# Patient Record
Sex: Female | Born: 1960 | Race: White | Hispanic: No | Smoking: Never smoker
Health system: Southern US, Community
[De-identification: ages and names within clinical notes are randomized; demographics above are authoritative.]

## PROBLEM LIST (undated history)

## (undated) DIAGNOSIS — D649 Anemia, unspecified: Secondary | ICD-10-CM

## (undated) DIAGNOSIS — I2510 Atherosclerotic heart disease of native coronary artery without angina pectoris: Secondary | ICD-10-CM

## (undated) DIAGNOSIS — R7303 Prediabetes: Secondary | ICD-10-CM

## (undated) DIAGNOSIS — I499 Cardiac arrhythmia, unspecified: Secondary | ICD-10-CM

## (undated) DIAGNOSIS — K219 Gastro-esophageal reflux disease without esophagitis: Secondary | ICD-10-CM

## (undated) DIAGNOSIS — E785 Hyperlipidemia, unspecified: Secondary | ICD-10-CM

## (undated) DIAGNOSIS — M1990 Unspecified osteoarthritis, unspecified site: Secondary | ICD-10-CM

## (undated) DIAGNOSIS — I10 Essential (primary) hypertension: Secondary | ICD-10-CM

## (undated) HISTORY — PX: DIAGNOSTIC LAPAROSCOPY: SUR761

## (undated) HISTORY — PX: CHOLECYSTECTOMY: SHX55

## (undated) HISTORY — PX: DILATION AND CURETTAGE OF UTERUS: SHX78

## (undated) HISTORY — PX: ABDOMINAL HYSTERECTOMY: SHX81

---

## 2004-03-26 ENCOUNTER — Ambulatory Visit: Payer: Self-pay | Admitting: Family Medicine

## 2004-04-06 ENCOUNTER — Ambulatory Visit: Payer: Self-pay | Admitting: Family Medicine

## 2005-05-12 ENCOUNTER — Ambulatory Visit: Payer: Self-pay | Admitting: Unknown Physician Specialty

## 2006-06-07 ENCOUNTER — Ambulatory Visit: Payer: Self-pay | Admitting: Unknown Physician Specialty

## 2007-07-12 ENCOUNTER — Ambulatory Visit: Payer: Self-pay | Admitting: Unknown Physician Specialty

## 2008-07-18 ENCOUNTER — Ambulatory Visit: Payer: Self-pay | Admitting: Unknown Physician Specialty

## 2009-04-14 ENCOUNTER — Ambulatory Visit: Payer: Self-pay | Admitting: Unknown Physician Specialty

## 2009-04-25 ENCOUNTER — Ambulatory Visit: Payer: Self-pay | Admitting: Unknown Physician Specialty

## 2009-08-06 ENCOUNTER — Ambulatory Visit: Payer: Self-pay | Admitting: Unknown Physician Specialty

## 2009-11-07 ENCOUNTER — Inpatient Hospital Stay (HOSPITAL_COMMUNITY): Admission: RE | Admit: 2009-11-07 | Discharge: 2009-11-08 | Payer: Self-pay | Admitting: Obstetrics & Gynecology

## 2009-11-07 ENCOUNTER — Encounter (INDEPENDENT_AMBULATORY_CARE_PROVIDER_SITE_OTHER): Payer: Self-pay | Admitting: Obstetrics & Gynecology

## 2010-05-14 LAB — BASIC METABOLIC PANEL
BUN: 10 mg/dL (ref 6–23)
CO2: 25 mEq/L (ref 19–32)
CO2: 26 mEq/L (ref 19–32)
Calcium: 8.4 mg/dL (ref 8.4–10.5)
Calcium: 8.9 mg/dL (ref 8.4–10.5)
Creatinine, Ser: 0.64 mg/dL (ref 0.4–1.2)
Creatinine, Ser: 0.65 mg/dL (ref 0.4–1.2)
GFR calc Af Amer: >60 mL/min (ref >60)
GFR calc Af Amer: >60 mL/min (ref >60)
Glucose, Bld: 100 mg/dL — ABNORMAL HIGH (ref 70–99)

## 2010-05-14 LAB — CBC
MCH: 30 pg (ref 26.0–34.0)
MCH: 30.5 pg (ref 26.0–34.0)
MCHC: 33.7 g/dL (ref 30.0–36.0)
MCHC: 34.4 g/dL (ref 30.0–36.0)
Platelets: 274 10*3/uL (ref 150–400)
Platelets: 292 10*3/uL (ref 150–400)
RBC: 4.46 MIL/uL (ref 3.87–5.11)
RDW: 15.3 % (ref 11.5–15.5)

## 2010-05-14 LAB — PREGNANCY, URINE: Preg Test, Ur: NEGATIVE

## 2010-10-13 ENCOUNTER — Ambulatory Visit: Payer: Self-pay | Admitting: Unknown Physician Specialty

## 2011-09-11 IMAGING — MG MAM DGTL SCREENING MAMMO W/CAD
1 series · 5 of 5 positions shown · non-contrast
Comparison: none

REASON FOR EXAM: SCR MAMMO
COMMENTS:

PROCEDURE:     MAM - MAM DGTL SCREENING MAMMO W/CAD  - October 13, 2010  [DATE]
RESULT:

[Series 4297: R CC · right · 5 of 5 slices shown]
[im 1/5]
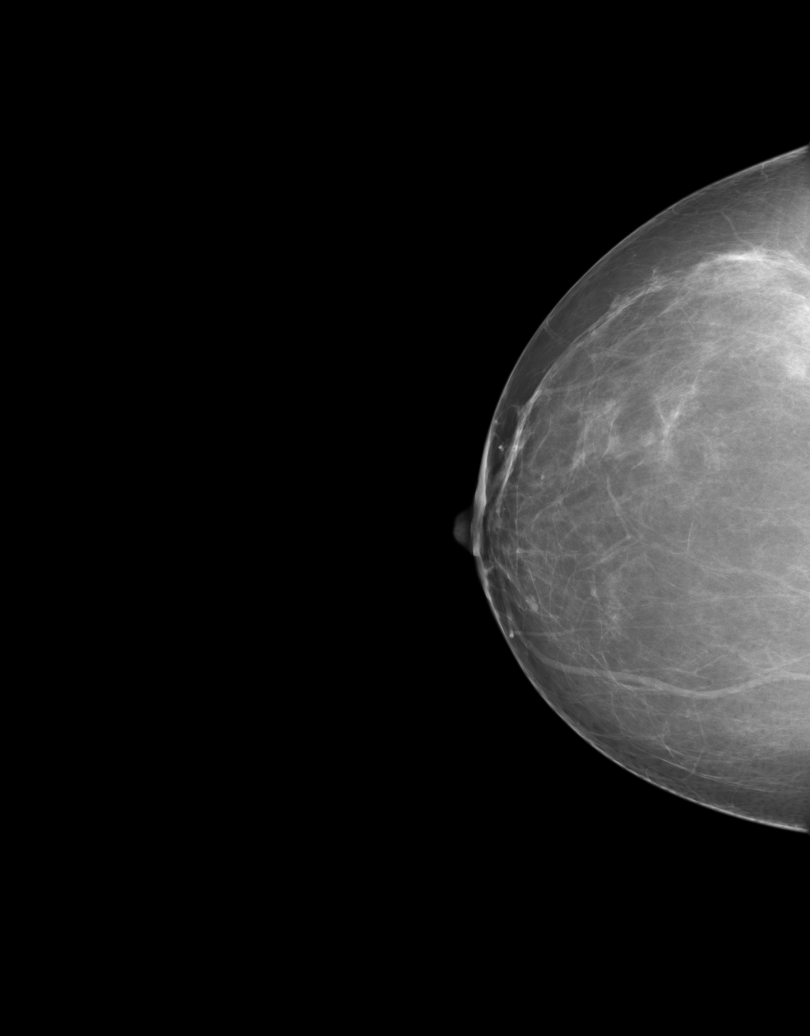
[im 2/5]
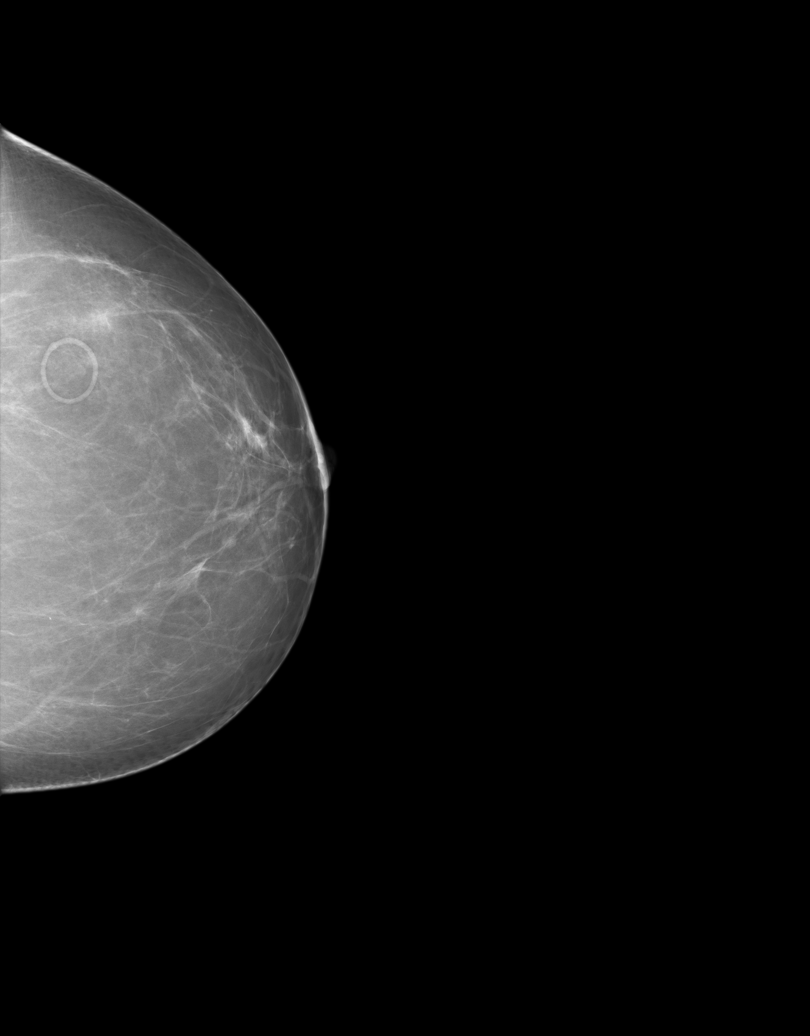
[im 3/5]
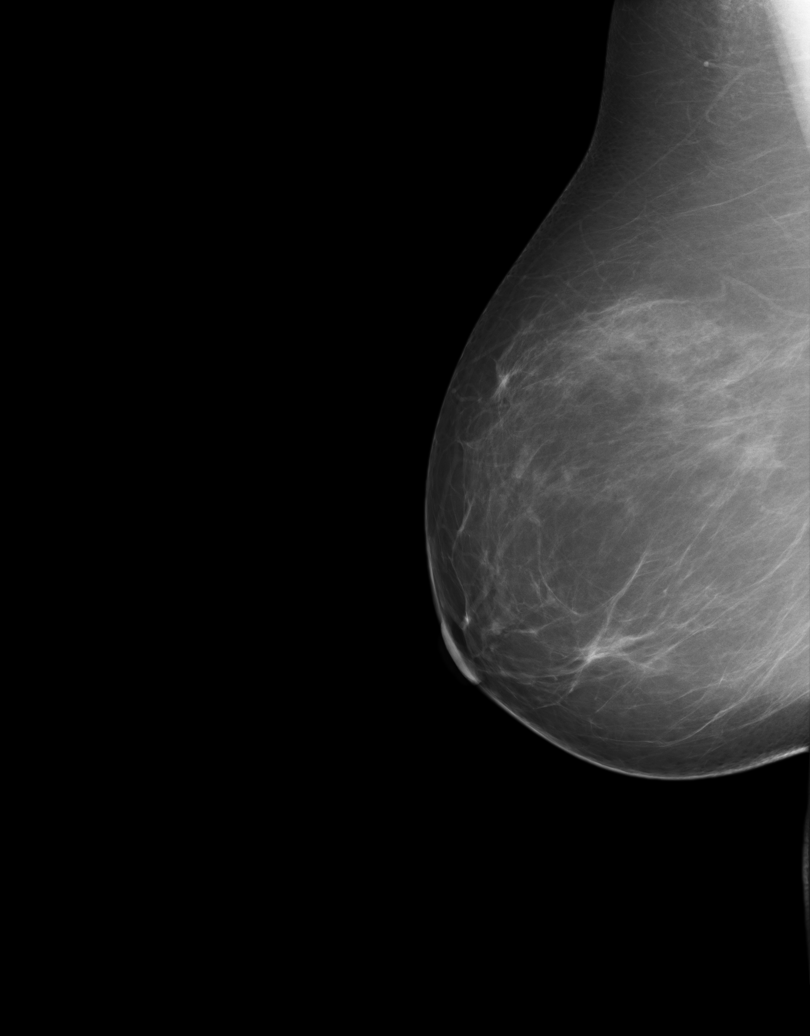
[im 4/5]
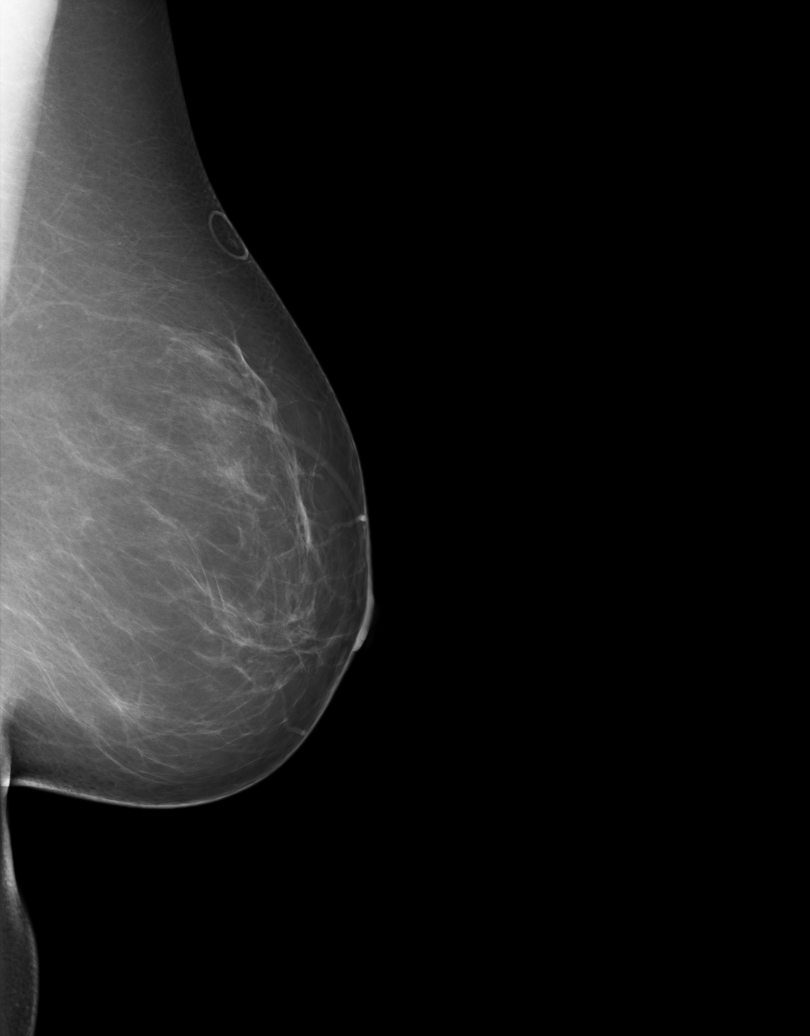
[im 5/5]
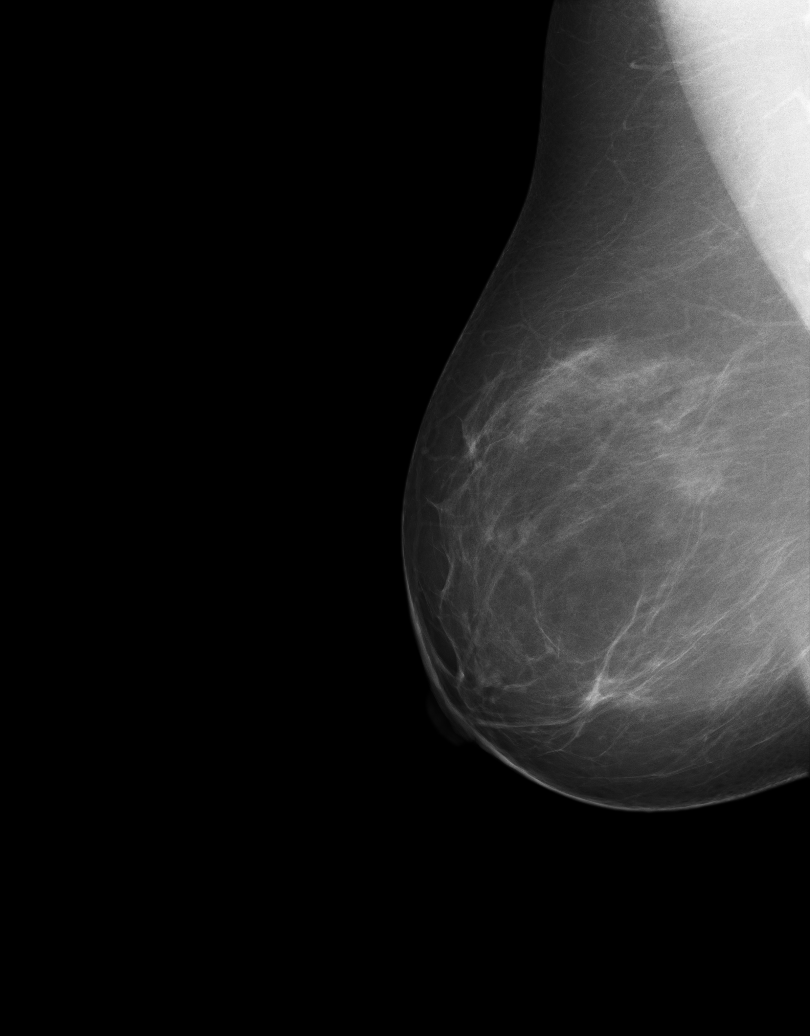

[5 of 5 positions shown; findings below may reference images not displayed]

FINDINGS: There is a family history of breast cancer in a paternal aunt.

Comparison is made to the previous digital images of 08/06/2009 as well as
07/18/2008 and 12/29/2000.

The breasts exhibit a mildly dense parenchymal pattern without evidence of
an area of developing parenchymal density or dominant mass. A skin lesion is
marked in the upper slightly lateral posterior left breast.
IMPRESSION: Stable, benign appearing bilateral mammogram.

BI-RADS: Category 2 - Benign Findings

RECOMMENDATION:  Please continue to encourage annual mammographic follow-up.

Thank you for this opportunity to contribute to the care of your patient.

A NEGATIVE MAMMOGRAM REPORT DOES NOT PRECLUDE BIOPSY OR OTHER EVALUATION OF
A CLINICALLY PALPABLE OR OTHERWISE SUSPICIOUS MASS OR LESION. BREAST CANCER
MAY NOT BE DETECTED BY MAMMOGRAPHY IN UP TO 10% OF CASES.

## 2011-09-16 ENCOUNTER — Ambulatory Visit: Payer: Self-pay | Admitting: Physician Assistant

## 2011-10-14 ENCOUNTER — Ambulatory Visit: Payer: Self-pay | Admitting: Physician Assistant

## 2012-10-16 ENCOUNTER — Ambulatory Visit: Payer: Self-pay | Admitting: Physician Assistant

## 2013-11-01 DIAGNOSIS — M1710 Unilateral primary osteoarthritis, unspecified knee: Secondary | ICD-10-CM | POA: Insufficient documentation

## 2013-12-11 ENCOUNTER — Ambulatory Visit: Payer: Self-pay | Admitting: Physician Assistant

## 2014-03-25 ENCOUNTER — Ambulatory Visit: Payer: Self-pay | Admitting: Gastroenterology

## 2014-06-24 LAB — SURGICAL PATHOLOGY

## 2014-12-05 ENCOUNTER — Other Ambulatory Visit: Payer: Self-pay | Admitting: Physician Assistant

## 2014-12-05 DIAGNOSIS — Z1231 Encounter for screening mammogram for malignant neoplasm of breast: Secondary | ICD-10-CM

## 2014-12-17 ENCOUNTER — Ambulatory Visit
Admission: RE | Admit: 2014-12-17 | Discharge: 2014-12-17 | Disposition: A | Discharge status: Home / Self Care | Payer: Managed Care, Other (non HMO) | Source: Ambulatory Visit | Attending: Physician Assistant | Admitting: Physician Assistant

## 2014-12-17 DIAGNOSIS — Z1231 Encounter for screening mammogram for malignant neoplasm of breast: Secondary | ICD-10-CM | POA: Insufficient documentation

## 2016-04-07 ENCOUNTER — Other Ambulatory Visit: Payer: Self-pay | Admitting: Physician Assistant

## 2016-04-07 DIAGNOSIS — Z1231 Encounter for screening mammogram for malignant neoplasm of breast: Secondary | ICD-10-CM

## 2016-04-30 ENCOUNTER — Ambulatory Visit: Payer: Managed Care, Other (non HMO)

## 2016-06-15 ENCOUNTER — Ambulatory Visit
Admission: RE | Admit: 2016-06-15 | Discharge: 2016-06-15 | Disposition: A | Discharge status: Home / Self Care | Payer: Managed Care, Other (non HMO) | Source: Ambulatory Visit | Attending: Physician Assistant | Admitting: Physician Assistant

## 2016-06-15 DIAGNOSIS — Z1231 Encounter for screening mammogram for malignant neoplasm of breast: Secondary | ICD-10-CM | POA: Insufficient documentation

## 2016-10-07 DIAGNOSIS — R7303 Prediabetes: Secondary | ICD-10-CM | POA: Insufficient documentation

## 2018-12-04 ENCOUNTER — Other Ambulatory Visit: Payer: Self-pay | Admitting: Physician Assistant

## 2018-12-04 DIAGNOSIS — Z1231 Encounter for screening mammogram for malignant neoplasm of breast: Secondary | ICD-10-CM

## 2019-03-05 ENCOUNTER — Ambulatory Visit
Admission: RE | Admit: 2019-03-05 | Discharge: 2019-03-05 | Disposition: A | Discharge status: Home / Self Care | Payer: 59 | Source: Ambulatory Visit | Attending: Physician Assistant | Admitting: Physician Assistant

## 2019-03-05 DIAGNOSIS — Z1231 Encounter for screening mammogram for malignant neoplasm of breast: Secondary | ICD-10-CM | POA: Insufficient documentation

## 2019-09-10 ENCOUNTER — Other Ambulatory Visit: Payer: Self-pay

## 2019-09-10 ENCOUNTER — Other Ambulatory Visit
Admission: RE | Admit: 2019-09-10 | Discharge: 2019-09-10 | Disposition: A | Discharge status: Home / Self Care | Payer: 59 | Source: Ambulatory Visit | Attending: Cardiology | Admitting: Cardiology

## 2019-09-10 DIAGNOSIS — Z01812 Encounter for preprocedural laboratory examination: Secondary | ICD-10-CM | POA: Diagnosis present

## 2019-09-10 DIAGNOSIS — Z20822 Contact with and (suspected) exposure to covid-19: Secondary | ICD-10-CM | POA: Insufficient documentation

## 2019-09-10 LAB — SARS CORONAVIRUS 2 (TAT 6-24 HRS): SARS Coronavirus 2: NEGATIVE

## 2019-09-12 ENCOUNTER — Encounter: Payer: Self-pay | Admitting: Cardiology

## 2019-09-12 ENCOUNTER — Encounter
Admission: RE | Disposition: A | Discharge status: Home / Self Care | Payer: Self-pay | Source: Home / Self Care | Attending: Cardiology

## 2019-09-12 ENCOUNTER — Other Ambulatory Visit: Payer: Self-pay

## 2019-09-12 ENCOUNTER — Ambulatory Visit
Admission: RE | Admit: 2019-09-12 | Discharge: 2019-09-12 | Disposition: A | Discharge status: Home / Self Care | Payer: 59 | Attending: Cardiology | Admitting: Cardiology

## 2019-09-12 DIAGNOSIS — K5790 Diverticulosis of intestine, part unspecified, without perforation or abscess without bleeding: Secondary | ICD-10-CM | POA: Insufficient documentation

## 2019-09-12 DIAGNOSIS — E669 Obesity, unspecified: Secondary | ICD-10-CM | POA: Insufficient documentation

## 2019-09-12 DIAGNOSIS — Z79899 Other long term (current) drug therapy: Secondary | ICD-10-CM | POA: Diagnosis not present

## 2019-09-12 DIAGNOSIS — Z7982 Long term (current) use of aspirin: Secondary | ICD-10-CM | POA: Diagnosis not present

## 2019-09-12 DIAGNOSIS — E785 Hyperlipidemia, unspecified: Secondary | ICD-10-CM | POA: Insufficient documentation

## 2019-09-12 DIAGNOSIS — R002 Palpitations: Secondary | ICD-10-CM | POA: Insufficient documentation

## 2019-09-12 DIAGNOSIS — I10 Essential (primary) hypertension: Secondary | ICD-10-CM | POA: Insufficient documentation

## 2019-09-12 DIAGNOSIS — R0789 Other chest pain: Secondary | ICD-10-CM | POA: Diagnosis not present

## 2019-09-12 DIAGNOSIS — I2510 Atherosclerotic heart disease of native coronary artery without angina pectoris: Secondary | ICD-10-CM | POA: Insufficient documentation

## 2019-09-12 DIAGNOSIS — Z6838 Body mass index (BMI) 38.0-38.9, adult: Secondary | ICD-10-CM | POA: Insufficient documentation

## 2019-09-12 DIAGNOSIS — D509 Iron deficiency anemia, unspecified: Secondary | ICD-10-CM | POA: Insufficient documentation

## 2019-09-12 DIAGNOSIS — R9439 Abnormal result of other cardiovascular function study: Secondary | ICD-10-CM | POA: Diagnosis present

## 2019-09-12 HISTORY — DX: Gastro-esophageal reflux disease without esophagitis: K21.9

## 2019-09-12 HISTORY — PX: LEFT HEART CATH AND CORONARY ANGIOGRAPHY: CATH118249

## 2019-09-12 HISTORY — DX: Essential (primary) hypertension: I10

## 2019-09-12 SURGERY — LEFT HEART CATH AND CORONARY ANGIOGRAPHY
Anesthesia: Moderate Sedation | Laterality: Left

## 2019-09-12 MED ORDER — FENTANYL CITRATE (PF) 100 MCG/2ML IJ SOLN
INTRAMUSCULAR | Status: DC | PRN
  Administered 2019-09-12: 50 ug via INTRAVENOUS

## 2019-09-12 MED ORDER — ACETAMINOPHEN 325 MG PO TABS: 650.0000 mg | ORAL_TABLET | ORAL | Status: DC | PRN

## 2019-09-12 MED ORDER — VERAPAMIL HCL 2.5 MG/ML IV SOLN
INTRAVENOUS | Status: DC | PRN
  Administered 2019-09-12: 2.5 mg via INTRA_ARTERIAL

## 2019-09-12 MED ORDER — LABETALOL HCL 5 MG/ML IV SOLN: 10.0000 mg | INTRAVENOUS | Status: DC | PRN

## 2019-09-12 MED ORDER — ASPIRIN 81 MG PO CHEW: 81.0000 mg | CHEWABLE_TABLET | ORAL | Status: AC

## 2019-09-12 MED ORDER — SODIUM CHLORIDE 0.9% FLUSH: 3.0000 mL | Freq: Two times a day (BID) | INTRAVENOUS | Status: DC

## 2019-09-12 MED ORDER — ONDANSETRON HCL 4 MG/2ML IJ SOLN: 4.0000 mg | Freq: Four times a day (QID) | INTRAMUSCULAR | Status: DC | PRN

## 2019-09-12 MED ORDER — FENTANYL CITRATE (PF) 100 MCG/2ML IJ SOLN
INTRAMUSCULAR | Status: AC
  Filled 2019-09-12: qty 2

## 2019-09-12 MED ORDER — HEPARIN SODIUM (PORCINE) 1000 UNIT/ML IJ SOLN
INTRAMUSCULAR | Status: AC
  Filled 2019-09-12: qty 1

## 2019-09-12 MED ORDER — HEPARIN (PORCINE) IN NACL 1000-0.9 UT/500ML-% IV SOLN
INTRAVENOUS | Status: DC | PRN
  Administered 2019-09-12: 1000 mL

## 2019-09-12 MED ORDER — SODIUM CHLORIDE 0.9 % WEIGHT BASED INFUSION: 1.0000 mL/kg/h | INTRAVENOUS | Status: DC

## 2019-09-12 MED ORDER — SODIUM CHLORIDE 0.9 % WEIGHT BASED INFUSION
3.0000 mL/kg/h | INTRAVENOUS | Status: AC
  Administered 2019-09-12: 3 mL/kg/h via INTRAVENOUS

## 2019-09-12 MED ORDER — SODIUM CHLORIDE 0.9% FLUSH: 3.0000 mL | INTRAVENOUS | Status: DC | PRN

## 2019-09-12 MED ORDER — IOHEXOL 300 MG/ML  SOLN
INTRAMUSCULAR | Status: DC | PRN
  Administered 2019-09-12: 75 mL

## 2019-09-12 MED ORDER — ASPIRIN 81 MG PO CHEW
CHEWABLE_TABLET | ORAL | Status: AC
  Administered 2019-09-12: 81 mg via ORAL
  Filled 2019-09-12: qty 1

## 2019-09-12 MED ORDER — MIDAZOLAM HCL 2 MG/2ML IJ SOLN
INTRAMUSCULAR | Status: AC
  Filled 2019-09-12: qty 2

## 2019-09-12 MED ORDER — HYDRALAZINE HCL 20 MG/ML IJ SOLN: 10.0000 mg | INTRAMUSCULAR | Status: DC | PRN

## 2019-09-12 MED ORDER — SODIUM CHLORIDE 0.9 % IV SOLN: 250.0000 mL | INTRAVENOUS | Status: DC | PRN

## 2019-09-12 MED ORDER — MIDAZOLAM HCL 2 MG/2ML IJ SOLN
INTRAMUSCULAR | Status: DC | PRN
  Administered 2019-09-12: 1 mg via INTRAVENOUS

## 2019-09-12 MED ORDER — HEPARIN (PORCINE) IN NACL 1000-0.9 UT/500ML-% IV SOLN
INTRAVENOUS | Status: AC
  Filled 2019-09-12: qty 1000

## 2019-09-12 MED ORDER — HEPARIN SODIUM (PORCINE) 1000 UNIT/ML IJ SOLN
INTRAMUSCULAR | Status: DC | PRN
  Administered 2019-09-12: 5000 [IU] via INTRAVENOUS

## 2019-09-12 MED ORDER — VERAPAMIL HCL 2.5 MG/ML IV SOLN
INTRAVENOUS | Status: AC
  Filled 2019-09-12: qty 2

## 2019-09-12 SURGICAL SUPPLY — 8 items
CATH 5F 110X4 TIG (CATHETERS) ×2 IMPLANT
CATH INFINITI 5FR ANG PIGTAIL (CATHETERS) ×2 IMPLANT
DEVICE RAD TR BAND REGULAR (VASCULAR PRODUCTS) ×2 IMPLANT
GLIDESHEATH SLEND SS 6F .021 (SHEATH) ×2 IMPLANT
GUIDEWIRE INQWIRE 1.5J.035X260 (WIRE) ×1 IMPLANT
INQWIRE 1.5J .035X260CM (WIRE) ×2
KIT MANI 3VAL PERCEP (MISCELLANEOUS) ×2 IMPLANT
PACK CARDIAC CATH (CUSTOM PROCEDURE TRAY) ×2 IMPLANT

## 2019-09-12 NOTE — Discharge Instructions (Signed)
Coronary Artery Disease, Female Coronary artery disease (CAD) is a condition in which the arteries that lead to the heart (coronary arteries) become narrow or blocked. The narrowing or blockage can lead to decreased blood flow to the heart. Prolonged reduced blood flow can cause a heart attack (myocardial infarction or MI). This condition may also be called coronary heart disease. Because CAD is the leading cause of death in women, it is important to understand what causes this condition and how it is treated. What are the causes? CAD is most often caused by atherosclerosis. This is the buildup of fat and cholesterol (plaque) on the inside of the arteries. Over time, the plaque may narrow or block the artery, reducing blood flow to the heart. Plaque can also become weak and break off within a coronary artery and cause a sudden blockage. Other less common causes of CAD include:  A blood clot or a piece of a blood clot or other substance that blocks the flow of blood in a coronary artery (embolism).  A tearing of the artery (spontaneous coronary artery dissection).  An enlargement of an artery (aneurysm).  Inflammation (vasculitis) in the artery wall. What increases the risk? The following factors may make you more likely to develop this condition:  Age. Women over age 66 are at a greater risk of CAD.  Family history of CAD.  High blood pressure (hypertension).  Diabetes.  High cholesterol levels.  Tobacco use.  Lack of exercise.  Menopause. ? All postmenopausal women are at greater risk of CAD. ? Women who have experienced menopause between the ages of 64-45 (early menopause) are at a higher risk of CAD. ? Women who have experienced menopause before age 77 (premature menopause) are at a very high risk of CAD.  Excessive alcohol use.  A diet high in saturated and trans fats, such as fried food and processed meat. Other possible risk factors include:  High stress  levels.  Depression.  Obesity.  Sleep apnea. What are the signs or symptoms? Many people do not have any symptoms during the early stages of CAD. As the condition progresses, symptoms may include:  Chest pain (angina). The pain can: ? Feel like crushing or squeezing, or like a tightness, pressure, fullness, or heaviness in the chest. ? Last more than a few minutes or can stop and recur. The pain tends to get worse with exercise or stress and to fade with rest.  Pain in the arms, neck, jaw, ear, or back.  Unexplained heartburn or indigestion.  Shortness of breath.  Nausea.  Sudden cold sweats.  Sudden light-headedness.  Fluttering or fast heartbeat (palpitations). Many women have chest discomfort and the other symptoms. However, women often have unusual (atypical) symptoms, such as:  Fatigue.  Vomiting.  Unexplained feelings of nervousness or anxiety.  Unexplained weakness.  Dizziness or fainting. How is this diagnosed? This condition is diagnosed based on:  Your family and medical history.  A physical exam.  Tests, including: ? A test to check the electrical signals in your heart (electrocardiogram). ? Exercise stress test. This looks for signs of blockage when the heart is stressed with exercise, such as running on a treadmill. ? Pharmacologic stress test. This test looks for signs of blockage when the heart is being stressed with a medicine. ? Blood tests. ? Coronary angiogram. This is a procedure to look at the coronary arteries to see if there is any blockage. During this test, a dye is injected into your arteries so they  appear on an X-ray. ? Coronary artery CT scan. This CT scan helps detect calcium deposits in your coronary arteries. Calcium deposits are an indicator of CAD. ? A test that uses sound waves to take a picture of your heart (echocardiogram). ? Chest X-ray. How is this treated? This condition may be treated by:  Healthy lifestyle changes to  reduce risk factors.  Medicines such as: ? Antiplatelet medicines and blood-thinning medicines, such as aspirin. These help to prevent blood clots. ? Nitroglycerin. ? Blood pressure medicines. ? Cholesterol-lowering medicine.  Coronary angioplasty and stenting. During this procedure, a thin, flexible tube is inserted through a blood vessel and into a blocked artery. A balloon or similar device on the end of the tube is inflated to open up the artery. In some cases, a small, mesh tube (stent) is inserted into the artery to keep it open.  Coronary artery bypass surgery. During this surgery, veins or arteries from other parts of the body are used to create a bypass around the blockage and allow blood to reach your heart. Follow these instructions at home: Medicines  Take over-the-counter and prescription medicines only as told by your health care provider.  Do not take the following medicines unless your health care provider approves: ? NSAIDs, such as ibuprofen, naproxen, or celecoxib. ? Vitamin supplements that contain vitamin A, vitamin E, or both. ? Hormone replacement therapy that contains estrogen with or without progestin. Lifestyle  Follow an exercise program approved by your health care provider. Aim for 150 minutes of moderate exercise or 75 minutes of vigorous exercise each week.  Maintain a healthy weight or lose weight as approved by your health care provider.  Learn to manage stress or try to limit your stress. Ask your health care provider for suggestions if you need help.  Get screened for depression and seek treatment, if needed.  Do not use any products that contain nicotine or tobacco, such as cigarettes, e-cigarettes, and chewing tobacco. If you need help quitting, ask your health care provider.  Do not use illegal drugs. Eating and drinking   Follow a heart-healthy diet. A dietitian can help educate you about healthy food options and changes. In general, eat  plenty of fruits and vegetables, lean meats, and whole grains.  Avoid foods high in: ? Sugar. ? Salt (sodium). ? Saturated fats, such as processed or fatty meat. ? Trans fats, such as fried food.  Use healthy cooking methods such as roasting, grilling, broiling, baking, poaching, steaming, or stir-frying.  Do not drink alcohol if: ? Your health care provider tells you not to drink. ? You are pregnant, may be pregnant, or are planning to become pregnant.  If you drink alcohol: ? Limit how much you have to 0-1 drink a day. ? Be aware of how much alcohol is in your drink. In the [place] one drink equals one 12 oz bottle of beer (355 mL), one 5 oz glass of wine (148 mL), or one 1 oz glass of hard liquor (44 mL). General instructions  Manage any other health conditions, such as hypertension and diabetes. These conditions affect your heart.  Your health care provider may ask you to monitor your blood pressure. Ideally, your blood pressure should be below 130/80.  Keep all follow-up visits as told by your health care provider. This is important. Get help right away if:  You have pain in your chest, neck, ear, arm, jaw, stomach, or back that: ? Lasts more than a few minutes. ?  Is recurring. ? Is not relieved by taking medicine under your tongue (sublingual nitroglycerin).  You have profuse sweating without cause.  You have unexplained: ? Heartburn or indigestion. ? Shortness of breath or difficulty breathing. ? Fluttering or fast heartbeat (palpitations). ? Nausea or vomiting. ? Fatigue. ? Feelings of nervousness or anxiety. ? Weakness. ? Diarrhea.  You have sudden light-headedness or dizziness.  You faint.  You feel like hurting yourself or think about taking your own life. These symptoms may represent a serious problem that is an emergency. Do not wait to see if the symptoms will go away. Get medical help right away. Call your local emergency services (911 in the [place] Do  not drive yourself to the hospital. Summary  Coronary artery disease (CAD) is a condition in which the arteries that lead to the heart (coronary arteries) become narrow or blocked. The narrowing or blockage can lead to a heart attack.  Many women have chest discomfort and other common symptoms of CAD. However, women often have unusual (atypical) symptoms, such as fatigue, vomiting, weakness, or dizziness.  CAD can be treated with lifestyle changes, medicines, surgery, or a combination of these treatments. This information is not intended to replace advice given to you by your health care provider. Make sure you discuss any questions you have with your health care provider. Document Revised: 11/04/2017 Document Reviewed: 10/25/2017 Elsevier Patient Education  2020 [employer]   Coronary Artery Bypass Grafting  Coronary artery bypass grafting (CABG) is a surgery to bypassor to fix arteries of the heart (coronary arteries) that are narrow or blocked. This narrowing is usually the result of a buildup of fatty deposits (plaques) in the walls of the vessels. The coronary arteries supply the heart with the oxygen and nutrients that it needs to pump blood through your body. In this surgery, a section of blood vessel from another part of the body (usually the chest, arm, or leg) is removed and then placed where it will allow blood to flow around the damaged part of the coronary artery. The new section of blood vessel is called the graft. Tell a health care provider about:  Any allergies you have.  All medicines you are taking or using, including steroids, blood thinners, vitamins, herbs, eye drops, creams, and over-the-counter medicines.  Any problems you or family members have had with anesthetic medicines.  Any blood disorders you have.  Any surgeries you have had.  Any medical conditions you have.  Whether you are pregnant or may be pregnant. What are the risks? Generally, this is a  safe procedure. However, problems may occur, including:  Bleeding, which may require transfusions.  Infection.  Allergic reactions to medicines or dyes.  Pain at the surgical site.  Damage to other structures or organs.  Short-term memory loss, confusion, and personality changes.  Heart rhythm problems (arrhythmias).  Stroke.  Heart attack during or after surgery.  Kidney failure. What happens before the procedure? Staying hydrated Follow instructions from your health care provider about hydration, which may include:  Up to 2 hours before the procedure - you may continue to drink clear liquids, such as water, clear fruit juice, black coffee, and plain tea.  Eating and drinking Follow instructions from your health care provider about eating and drinking, which may include:  8 hours before the procedure - stop eating heavy meals or foods, such as meat, fried foods, or fatty foods.  6 hours before the procedure - stop eating light meals or foods, such  as toast or cereal.  6 hours before the procedure - stop drinking milk or drinks that contain milk.  2 hours before the procedure - stop drinking clear liquids. Medicines  Take over-the-counter and prescription medicines only as told by your health care provider.  Ask your health care provider about: ? Changing or stopping your regular medicines. This is especially important if you are taking diabetes medicines or blood thinners. You may be asked to start new medicines and stop taking others. Do not stop medicines or adjust dosages on your own. ? Taking medicines such as aspirin and ibuprofen. These medicines can thin your blood. Do not take these medicines unless your health care provider tells you to take them. ? Taking over-the-counter medicines, vitamins, herbs, and supplements. General instructions  Ask your health care provider: ? How your surgical site will be marked or identified. ? What steps will be taken to help  prevent infection. These may include:  Removing hair at the surgery site.  Washing skin with a germ-killing soap.  Taking antibiotic medicine.  You may be asked to shower with a germ-killing soap.  For 3-6 weeks before the procedure, do not use any products that contain nicotine or tobacco, such as cigarettes, e-cigarettes, and chewing tobacco. Quitting smoking is one of the best things you can do for your heart health. If you need help quitting, ask your health care provider.  Talk with your health care provider about where the grafts will be taken from for your surgery. What happens during the procedure?  An IV will be inserted into one of your veins.  You will be given one or more of the following: ? A medicine to help you relax (sedative). ? A medicine to make you fall asleep (general anesthetic).  An incision will be made down the front of the chest through the breastbone (sternum). The sternum will be opened so the surgeon can see the heart.  You may or may not be placed on a heart-lung bypass machine. If this machine is used, your heart will be temporarily stopped. The machine will provide oxygen to your blood while the surgeon works on your heart. If the machine is not used, it is called beating heart bypass surgery.  A section of blood vessel will be removed from another part of your body (usually the chest, arm, or leg).  The blood vessel will be attached above and below the blocked artery of your heart. This may be done on more than one artery of the heart.  When the bypass is done, you will be taken off the heart-lung machine if it was used.  If your heart was stopped, it will be restarted and will take over again normally.  Your chest will be closed with special surgical wire to hold your bones together for healing.  Your incision(s) will be closed with stitches (sutures), skin glue, or adhesive strips.  Bandages (dressings) will be placed over the  incision(s).  Tubes will remain in your chest and will be connected to a suction device to help drain fluid and reinflate the lungs. The procedure may vary among health care providers and hospitals. What happens after the procedure?  Your blood pressure, heart rate, breathing rate, and blood oxygen level will be monitored until you leave the hospital.  You may wake up with a tube in your throat to help your breathing. You may be connected to a breathing machine. You will not be able to talk while the tube is  in place. The tube will be taken out as soon as it is safe.  You will be groggy and may have some pain. You will be given pain medicine to help control the pain.  You may be in the intensive care unit for 1-2 days.  You may be given oxygen to help you breathe.  You will be shown how to do deep breathing exercises.  You may have to wear compression stockings. These stockings help to prevent blood clots and reduce swelling in your legs.  You may be given new medicines to take after your surgery.  Cardiac rehabilitation will be started while you are in the hospital. This may include education and exercises to help you recover from your surgery. Summary  In this procedure, a section of blood vessel from another part of the body (usually the chest, arm, or leg) is removed and then placed where it will allow blood to bypass the narrowed or blocked part of the coronary artery.  For 3-6 weeks before the procedure, do not use any products that contain nicotine or tobacco, such as cigarettes, e-cigarettes, and chewing tobacco. If you need help quitting, ask your health care provider.  You may or may not be placed on a heart-lung bypass machine during the surgery. If used, this machine will provide oxygen to your blood while the surgeon works on your heart.  You may wake up with a tube in your throat to help your breathing. You may be connected to a breathing machine. The tube will be taken  out as soon as it is safe. This information is not intended to replace advice given to you by your health care provider. Make sure you discuss any questions you have with your health care provider. Document Revised: 10/25/2017 Document Reviewed: 10/25/2017 Elsevier Patient Education  2020 [employer]   Angiogram, Care After This sheet gives you information about how to care for yourself after your procedure. Your doctor may also give you more specific instructions. If you have problems or questions, contact your doctor. Follow these instructions at home: Insertion site care  Follow instructions from your doctor about how to take care of your long, thin tube (catheter) insertion area. Make sure you: ? Wash your hands with soap and water before you change your bandage (dressing). If you cannot use soap and water, use hand sanitizer. ? Change your bandage as told by your doctor. ? Leave stitches (sutures), skin glue, or skin tape (adhesive) strips in place. They may need to stay in place for 2 weeks or longer. If tape strips get loose and curl up, you may trim the loose edges. Do not remove tape strips completely unless your doctor says it is okay.  Do not take baths, swim, or use a hot tub until your doctor says it is okay.  You may shower 24-48 hours after the procedure or as told by your doctor. ? Gently wash the area with plain soap and water. ? Pat the area dry with a clean towel. ? Do not rub the area. This may cause bleeding.  Do not apply powder or lotion to the area. Keep the area clean and dry.  Check your insertion area every day for signs of infection. Check for: ? More redness, swelling, or pain. ? Fluid or blood. ? Warmth. ? Pus or a bad smell. Activity  Rest as told by your doctor, usually for 1-2 days.  Do not lift anything that is heavier than 10 lbs. (4.5 kg)  or as told by your doctor.  Do not drive for 24 hours if you were given a medicine to help you relax  (sedative).  Do not drive or use heavy machinery while taking prescription pain medicine. General instructions   Go back to your normal activities as told by your doctor, usually in about a week. Ask your doctor what activities are safe for you.  If the insertion area starts to bleed, lie flat and put pressure on the area. If the bleeding does not stop, get help right away. This is an emergency.  Drink enough fluid to keep your pee (urine) clear or pale yellow.  Take over-the-counter and prescription medicines only as told by your doctor.  Keep all follow-up visits as told by your doctor. This is important. Contact a doctor if:  You have a fever.  You have chills.  You have more redness, swelling, or pain around your insertion area.  You have fluid or blood coming from your insertion area.  The insertion area feels warm to the touch.  You have pus or a bad smell coming from your insertion area.  You have more bruising around the insertion area.  Blood collects in the tissue around the insertion area (hematoma) that may be painful to the touch. Get help right away if:  You have a lot of pain in the insertion area.  The insertion area swells very fast.  The insertion area is bleeding, and the bleeding does not stop after holding steady pressure on the area.  The area near or just beyond the insertion area becomes pale, cool, tingly, or numb. These symptoms may be an emergency. Do not wait to see if the symptoms will go away. Get medical help right away. Call your local emergency services (911 in the [place] Do not drive yourself to the hospital. Summary  After the procedure, it is common to have bruising and tenderness at the long, thin tube insertion area.  After the procedure, it is important to rest and drink plenty of fluids.  Do not take baths, swim, or use a hot tub until your doctor says it is okay to do so. You may shower 24-48 hours after the procedure or as told  by your doctor.  If the insertion area starts to bleed, lie flat and put pressure on the area. If the bleeding does not stop, get help right away. This is an emergency. This information is not intended to replace advice given to you by your health care provider. Make sure you discuss any questions you have with your health care provider. Document Revised: 01/28/2017 Document Reviewed: 02/10/2016 Elsevier Patient Education  2020 [employer]   Radial Site Care  This sheet gives you information about how to care for yourself after your procedure. Your health care provider may also give you more specific instructions. If you have problems or questions, contact your health care provider. What can I expect after the procedure? After the procedure, it is common to have:  Bruising and tenderness at the catheter insertion area. Follow these instructions at home: Medicines  Take over-the-counter and prescription medicines only as told by your health care provider. Insertion site care  Follow instructions from your health care provider about how to take care of your insertion site. Make sure you: ? Wash your hands with soap and water before you change your bandage (dressing). If soap and water are not available, use hand sanitizer. ? Change your dressing as told by your health  care provider. ? Leave stitches (sutures), skin glue, or adhesive strips in place. These skin closures may need to stay in place for 2 weeks or longer. If adhesive strip edges start to loosen and curl up, you may trim the loose edges. Do not remove adhesive strips completely unless your health care provider tells you to do that.  Check your insertion site every day for signs of infection. Check for: ? Redness, swelling, or pain. ? Fluid or blood. ? Pus or a bad smell. ? Warmth.  Do not take baths, swim, or use a hot tub until your health care provider approves.  You may shower 24-48 hours after the procedure, or as  directed by your health care provider. ? Remove the dressing and gently wash the site with plain soap and water. ? Pat the area dry with a clean towel. ? Do not rub the site. That could cause bleeding.  Do not apply powder or lotion to the site. Activity   For 24 hours after the procedure, or as directed by your health care provider: ? Do not flex or bend the affected arm. ? Do not push or pull heavy objects with the affected arm. ? Do not drive yourself home from the hospital or clinic. You may drive 24 hours after the procedure unless your health care provider tells you not to. ? Do not operate machinery or power tools.  Do not lift anything that is heavier than 10 lb (4.5 kg), or the limit that you are told, until your health care provider says that it is safe.  Ask your health care provider when it is okay to: ? Return to work or school. ? Resume usual physical activities or sports. ? Resume sexual activity. General instructions  If the catheter site starts to bleed, raise your arm and put firm pressure on the site. If the bleeding does not stop, get help right away. This is a medical emergency.  If you went home on the same day as your procedure, a responsible adult should be with you for the first 24 hours after you arrive home.  Keep all follow-up visits as told by your health care provider. This is important. Contact a health care provider if:  You have a fever.  You have redness, swelling, or yellow drainage around your insertion site. Get help right away if:  You have unusual pain at the radial site.  The catheter insertion area swells very fast.  The insertion area is bleeding, and the bleeding does not stop when you hold steady pressure on the area.  Your arm or hand becomes pale, cool, tingly, or numb. These symptoms may represent a serious problem that is an emergency. Do not wait to see if the symptoms will go away. Get medical help right away. Call your local  emergency services (911 in the [place] Do not drive yourself to the hospital. Summary  After the procedure, it is common to have bruising and tenderness at the site.  Follow instructions from your health care provider about how to take care of your radial site wound. Check the wound every day for signs of infection.  Do not lift anything that is heavier than 10 lb (4.5 kg), or the limit that you are told, until your health care provider says that it is safe. This information is not intended to replace advice given to you by your health care provider. Make sure you discuss any questions you have with your health care provider. Document  Revised: 03/23/2017 Document Reviewed: 03/23/2017 Elsevier Patient Education  2020 [employer]   Moderate Conscious Sedation, Adult, Care After These instructions provide you with information about caring for yourself after your procedure. Your health care provider may also give you more specific instructions. Your treatment has been planned according to current medical practices, but problems sometimes occur. Call your health care provider if you have any problems or questions after your procedure. What can I expect after the procedure? After your procedure, it is common:  To feel sleepy for several hours.  To feel clumsy and have poor balance for several hours.  To have poor judgment for several hours.  To vomit if you eat too soon. Follow these instructions at home: For at least 24 hours after the procedure:   Do not: ? Participate in activities where you could fall or become injured. ? Drive. ? Use heavy machinery. ? Drink alcohol. ? Take sleeping pills or medicines that cause drowsiness. ? Make important decisions or sign legal documents. ? Take care of children on your own.  Rest. Eating and drinking  Follow the diet recommended by your health care provider.  If you vomit: ? Drink water, juice, or soup when you can drink without  vomiting. ? Make sure you have little or no nausea before eating solid foods. General instructions  Have a responsible adult stay with you until you are awake and alert.  Take over-the-counter and prescription medicines only as told by your health care provider.  If you smoke, do not smoke without supervision.  Keep all follow-up visits as told by your health care provider. This is important. Contact a health care provider if:  You keep feeling nauseous or you keep vomiting.  You feel light-headed.  You develop a rash.  You have a fever. Get help right away if:  You have trouble breathing. This information is not intended to replace advice given to you by your health care provider. Make sure you discuss any questions you have with your health care provider. Document Revised: 01/28/2017 Document Reviewed: 06/07/2015 Elsevier Patient Education  2020 [employer].

## 2019-09-17 ENCOUNTER — Other Ambulatory Visit: Payer: Self-pay

## 2019-09-17 ENCOUNTER — Other Ambulatory Visit: Payer: Self-pay | Admitting: *Deleted

## 2019-09-17 ENCOUNTER — Institutional Professional Consult (permissible substitution): Payer: 59 | Admitting: Cardiothoracic Surgery

## 2019-09-17 VITALS — BP 154/85 | HR 60 | Temp 97.7°F | Resp 16 | Ht 65.0 in | Wt 234.0 lb

## 2019-09-17 DIAGNOSIS — I209 Angina pectoris, unspecified: Secondary | ICD-10-CM

## 2019-09-17 DIAGNOSIS — I2510 Atherosclerotic heart disease of native coronary artery without angina pectoris: Secondary | ICD-10-CM

## 2019-09-17 NOTE — Progress Notes (Signed)
[address]       [place] [id number]             [phone]     CARDIOTHORACIC SURGERY CONSULTATION REPORT  Referring Provider is Marcina Millard, MD Primary Cardiologist is No primary care provider on file. PCP is Patrice Paradise, MD  Chief Complaint  Patient presents with  . Coronary Artery Disease    Surgical consult, cardiac cath 09/12/19    HPI:  59 year old lady is referred for consideration of CABG.  She was in her usual state of health until approximately 1 month ago when she began to noticed palpitations and chest pain with exertion.  She reported the symptoms to her local physicians in the [place] area.  Due to the uncertainty of her symptoms she underwent CT FFR at [facility].  This suggested severe coronary artery stenoses.  She underwent left heart catheterization in Day which demonstrated severe multivessel coronary artery disease.  She has preserved left ventricular function.  She is now referred for consideration of CABG.  Since her work-up her pain has been relatively stable.  She denies any history of stroke, PND,  or orthopnea.  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  Past Medical History:  Diagnosis Date  . Acid reflux   . Hypertension     Past Surgical History:  Procedure Laterality Date  . LEFT HEART CATH AND CORONARY ANGIOGRAPHY Left 09/12/2019   Procedure: LEFT HEART CATH AND CORONARY ANGIOGRAPHY;  Surgeon: Marcina Millard, MD;  Location: [facility] INVASIVE CV LAB;  Service: Cardiovascular;  Laterality: Left;    Family History  Problem Relation Age of Onset  . Breast cancer Paternal Aunt     Social History   Socioeconomic History  . Marital status: Divorced    Spouse name: Not on file  . Number of children: Not on file  . Years of education: Not on file  . Highest education level: Not on file  Occupational History  . Not on file  Tobacco Use  . Smoking status: Never Smoker  .  Smokeless tobacco: Never Used  Vaping Use  . Vaping Use: Never used  Substance and Sexual Activity  . Alcohol use: Never  . Drug use: Never  . Sexual activity: Not on file  Other Topics Concern  . Not on file  Social History Narrative  . Not on file   Social Determinants of Health   Financial Resource Strain:   . Difficulty of Paying Living Expenses:   Food Insecurity:   . Worried About Programme researcher, broadcasting/film/video in the Last Year:   . Barista in the Last Year:   Transportation Needs:   . Freight forwarder (Medical):   Marland Kitchen Lack of Transportation (Non-Medical):   Physical Activity:   . Days of Exercise per Week:   . Minutes of Exercise per Session:   Stress:   . Feeling of Stress :   Social Connections:   . Frequency of Communication with Friends and Family:   . Frequency of Social Gatherings with Friends and Family:   . Attends Religious Services:   . Active Member of Clubs or Organizations:   . Attends Banker Meetings:   Marland Kitchen Marital Status:   Intimate Partner Violence:   . Fear of Current or Ex-Partner:   . Emotionally Abused:   Marland Kitchen Physically Abused:   . Sexually Abused:     Current Outpatient Medications  Medication Sig Dispense Refill  . aspirin EC 81 MG tablet Take 81 mg by mouth daily. Swallow whole.    . Calcium Carb-Cholecalciferol (CALCIUM-VITAMIN D) 600-400 MG-UNIT TABS Take 1 tablet by mouth daily.    . isosorbide mononitrate (IMDUR) 30 MG 24 hr tablet Take 30 mg by mouth daily.    . metoprolol succinate (TOPROL-XL) 25 MG 24 hr tablet Take 25 mg by mouth daily.    . Multiple Vitamins-Minerals (MULTIVITAMIN WITH MINERALS) tablet Take 1 tablet by mouth daily.    Marland Kitchen omeprazole (PRILOSEC) 20 MG capsule Take 20 mg by mouth daily.     No current facility-administered medications for this visit.    No Known Allergies    Review of Systems:   General:  Good appetite, stable energy, no change weight    Cardiac:  As per HPI  Respiratory:  No  shortness of breath, no cough,   GI:   History of colonoscopy 5 years ago; history of colonic polyps and diverticulosis  GU:   Denies dysuria or kidney disease  Vascular:  Negative  Neuro:   Negative  Musculoskeletal: Negative  Skin:   Negative  Psych:   Negative  Eyes:   Negative  ENT:   Negative  Hematologic:  History of anemia  Endocrine:  No diabetes, does not check CBG's at home     Physical Exam:   BP (!) 154/85   Pulse 60   Temp 97.7 F (36.5 C) (Temporal)   Resp 16   Ht 5\' 5"  (1.651 m)   Wt 106.1 kg   SpO2 100% Comment: RA  BMI 38.94 kg/m   General:   well-appearing  HEENT:  Unremarkable   Neck:   no JVD, no bruits, no adenopathy   Chest:   clear to auscultation, symmetrical breath sounds, no wheezes, no rhonchi   CV:   RRR, no murmur   Abdomen:  soft, non-tender, no masses   Extremities:  warm, well-perfused, pulses intact, no LE edema  Rectal/GU  Deferred  Neuro:   Grossly non-focal and symmetrical throughout  Skin:   Clean and dry, no rashes, no breakdown   Diagnostic Tests:  I have personally reviewed her available imaging studies including left heart catheterization performed on 09/12/2019 and agree with the interpretation.  She has severe multivessel coronary artery disease including subtotal occlusion of the circumflex and right coronary systems.   Impression:  59 year old lady with severe multivessel coronary artery disease and preserved ejection fraction.  Agree with suggestion for CABG as best therapy with expectation for best prolonged survival.  Plan:  Schedule coronary bypass grafting on 09/25/2019.  She will undergo completion of her routine work-up in the interim.  This would include pre-CABG Doppler assessment of extracranial carotid arteries and bilateral radial arteries.    I spent in excess of 30 minutes during the conduct of this office consultation and >50% of this time involved direct face-to-face encounter with the patient for  counseling and/or coordination of their care.          Level 3 Office Consult = 40 minutes         Level 4 Office Consult = 60 minutes         Level 5 Office Consult = 80 minutes  B.  09/27/2019, MD 09/17/2019 2:06 PM

## 2019-09-18 ENCOUNTER — Encounter: Payer: Self-pay | Admitting: *Deleted

## 2019-09-18 DIAGNOSIS — E782 Mixed hyperlipidemia: Secondary | ICD-10-CM | POA: Insufficient documentation

## 2019-09-18 DIAGNOSIS — I10 Essential (primary) hypertension: Secondary | ICD-10-CM | POA: Insufficient documentation

## 2019-09-18 DIAGNOSIS — D649 Anemia, unspecified: Secondary | ICD-10-CM | POA: Insufficient documentation

## 2019-09-18 DIAGNOSIS — E669 Obesity, unspecified: Secondary | ICD-10-CM | POA: Insufficient documentation

## 2019-09-18 DIAGNOSIS — M47816 Spondylosis without myelopathy or radiculopathy, lumbar region: Secondary | ICD-10-CM | POA: Insufficient documentation

## 2019-09-20 NOTE — Progress Notes (Signed)
Your procedure is scheduled on Tuesday, July 27th.  Report to [facility] Main Entrance "A" at 5:30 A.M., and check in at the Admitting office.  Call this number if you have problems the morning of surgery:  [phone]  Call [phone] if you have any questions prior to your surgery date Monday-Friday 8am-4pm   Remember:  Do not eat or drink after midnight the night before your surgery    Take these medicines the morning of surgery with A SIP OF WATER  isosorbide mononitrate (IMDUR)  metoprolol succinate (TOPROL-XL)  omeprazole (PRILOSEC)   Follow your surgeon's instructions on when to stop Aspirin.  If no instructions were given by your surgeon then you will need to call the office to get those instructions.    As of today, STOP taking  Aleve, Naproxen, Ibuprofen, Motrin, Advil, Goody's, BC's, all herbal medications, fish oil, and all vitamins.             Do not wear jewelry, make up, or nail polish            Do not wear lotions, powders, perfumes/colognes, or deodorant.            Do not shave 48 hours prior to surgery.            Do not bring valuables to the hospital.            [facility] is not responsible for any belongings or valuables.  Do NOT Smoke (Tobacco/Vaping) or drink Alcohol 24 hours prior to your procedure If you use a CPAP at night, you may bring all equipment for your overnight stay.   Contacts, glasses, dentures or bridgework may not be worn into surgery.      For patients admitted to the hospital, discharge time will be determined by your treatment team.   Patients discharged the day of surgery will not be allowed to drive home, and someone needs to stay with them for 24 hours.  Special instructions:   [place]- Preparing For Surgery  Before surgery, you can play an important role. Because skin is not sterile, your skin needs to be as free of germs as possible. You can reduce the number of germs on your skin by washing with CHG (chlorahexidine  gluconate) Soap before surgery.  CHG is an antiseptic cleaner which kills germs and bonds with the skin to continue killing germs even after washing.    Oral Hygiene is also important to reduce your risk of infection.  Remember - BRUSH YOUR TEETH THE MORNING OF SURGERY WITH YOUR REGULAR TOOTHPASTE  Please do not use if you have an allergy to CHG or antibacterial soaps. If your skin becomes reddened/irritated stop using the CHG.  Do not shave (including legs and underarms) for at least 48 hours prior to first CHG shower. It is OK to shave your face.  Please follow these instructions carefully.   1. Shower the NIGHT BEFORE SURGERY and the MORNING OF SURGERY with CHG Soap.   2. If you chose to wash your hair, wash your hair first as usual with your normal shampoo.  3. After you shampoo, rinse your hair and body thoroughly to remove the shampoo.  4. Use CHG as you would any other liquid soap. You can apply CHG directly to the skin and wash gently with a scrungie or a clean washcloth.   5. Apply the CHG Soap to your body ONLY FROM THE NECK DOWN.  Do not use on open wounds  or open sores. Avoid contact with your eyes, ears, mouth and genitals (private parts). Wash Face and genitals (private parts)  with your normal soap.   6. Wash thoroughly, paying special attention to the area where your surgery will be performed.  7. Thoroughly rinse your body with warm water from the neck down.  8. DO NOT shower/wash with your normal soap after using and rinsing off the CHG Soap.  9. Pat yourself dry with a CLEAN TOWEL.  10. Wear CLEAN PAJAMAS to bed the night before surgery  11. Place CLEAN SHEETS on your bed the night of your first shower and DO NOT SLEEP WITH PETS.  Day of Surgery: Wear Clean/Comfortable clothing the morning of surgery Do not apply any deodorants/lotions.   Remember to brush your teeth WITH YOUR REGULAR TOOTHPASTE.   Please read over the following fact sheets that you were  given.

## 2019-09-21 ENCOUNTER — Encounter (HOSPITAL_COMMUNITY): Payer: Self-pay

## 2019-09-21 ENCOUNTER — Other Ambulatory Visit (HOSPITAL_COMMUNITY)
Admission: RE | Admit: 2019-09-21 | Discharge: 2019-09-21 | Disposition: A | Discharge status: Home / Self Care | Payer: 59 | Source: Ambulatory Visit | Attending: Cardiothoracic Surgery | Admitting: Cardiothoracic Surgery

## 2019-09-21 ENCOUNTER — Other Ambulatory Visit: Payer: Self-pay

## 2019-09-21 ENCOUNTER — Encounter (HOSPITAL_COMMUNITY)
Admission: RE | Admit: 2019-09-21 | Discharge: 2019-09-21 | Disposition: A | Discharge status: Home / Self Care | Payer: 59 | Source: Ambulatory Visit | Attending: Cardiothoracic Surgery | Admitting: Cardiothoracic Surgery

## 2019-09-21 ENCOUNTER — Ambulatory Visit (HOSPITAL_BASED_OUTPATIENT_CLINIC_OR_DEPARTMENT_OTHER)
Admission: RE | Admit: 2019-09-21 | Discharge: 2019-09-21 | Disposition: A | Discharge status: Home / Self Care | Payer: 59 | Source: Ambulatory Visit | Attending: Cardiothoracic Surgery | Admitting: Cardiothoracic Surgery

## 2019-09-21 ENCOUNTER — Ambulatory Visit (HOSPITAL_COMMUNITY)
Admission: RE | Admit: 2019-09-21 | Discharge: 2019-09-21 | Disposition: A | Discharge status: Home / Self Care | Payer: 59 | Source: Ambulatory Visit | Attending: Cardiothoracic Surgery | Admitting: Cardiothoracic Surgery

## 2019-09-21 DIAGNOSIS — Z20822 Contact with and (suspected) exposure to covid-19: Secondary | ICD-10-CM | POA: Diagnosis not present

## 2019-09-21 DIAGNOSIS — I2510 Atherosclerotic heart disease of native coronary artery without angina pectoris: Secondary | ICD-10-CM

## 2019-09-21 DIAGNOSIS — E785 Hyperlipidemia, unspecified: Secondary | ICD-10-CM | POA: Diagnosis not present

## 2019-09-21 DIAGNOSIS — Z01818 Encounter for other preprocedural examination: Secondary | ICD-10-CM | POA: Diagnosis not present

## 2019-09-21 DIAGNOSIS — I10 Essential (primary) hypertension: Secondary | ICD-10-CM | POA: Insufficient documentation

## 2019-09-21 HISTORY — DX: Atherosclerotic heart disease of native coronary artery without angina pectoris: I25.10

## 2019-09-21 HISTORY — DX: Prediabetes: R73.03

## 2019-09-21 LAB — CBC
HCT: 43 % (ref 36.0–46.0)
Hemoglobin: 13.7 g/dL (ref 12.0–15.0)
MCH: 29.7 pg (ref 26.0–34.0)
MCHC: 31.9 g/dL (ref 30.0–36.0)
MCV: 93.1 fL (ref 80.0–100.0)
Platelets: 269 10*3/uL (ref 150–400)
RBC: 4.62 MIL/uL (ref 3.87–5.11)
RDW: 12.7 % (ref 11.5–15.5)
WBC: 4.6 10*3/uL (ref 4.0–10.5)
nRBC: 0 % (ref 0.0–0.2)

## 2019-09-21 LAB — URINALYSIS, ROUTINE W REFLEX MICROSCOPIC
Bacteria, UA: NONE SEEN
Bilirubin Urine: NEGATIVE
Glucose, UA: NEGATIVE mg/dL
Hgb urine dipstick: NEGATIVE
Ketones, ur: NEGATIVE mg/dL
Nitrite: NEGATIVE
Protein, ur: NEGATIVE mg/dL
Specific Gravity, Urine: 1.02 (ref 1.005–1.030)
pH: 7 (ref 5.0–8.0)

## 2019-09-21 LAB — COMPREHENSIVE METABOLIC PANEL
ALT: 39 U/L (ref 0–44)
AST: 30 U/L (ref 15–41)
Albumin: 4.2 g/dL (ref 3.5–5.0)
Alkaline Phosphatase: 114 U/L (ref 38–126)
Anion gap: 9 (ref 5–15)
BUN: 22 mg/dL — ABNORMAL HIGH (ref 6–20)
CO2: 26 mmol/L (ref 22–32)
Calcium: 9.6 mg/dL (ref 8.9–10.3)
Chloride: 103 mmol/L (ref 98–111)
Creatinine, Ser: 0.69 mg/dL (ref 0.44–1.00)
GFR calc Af Amer: >60 mL/min (ref >60)
GFR calc non Af Amer: >60 mL/min (ref >60)
Glucose, Bld: 102 mg/dL — ABNORMAL HIGH (ref 70–99)
Potassium: 4.3 mmol/L (ref 3.5–5.1)
Sodium: 138 mmol/L (ref 135–145)
Total Bilirubin: 0.8 mg/dL (ref 0.3–1.2)
Total Protein: 7.3 g/dL (ref 6.5–8.1)

## 2019-09-21 LAB — SURGICAL PCR SCREEN
MRSA, PCR: NEGATIVE
Staphylococcus aureus: POSITIVE — AB

## 2019-09-21 LAB — PROTIME-INR
INR: 1 (ref 0.8–1.2)
Prothrombin Time: 12.9 seconds (ref 11.4–15.2)

## 2019-09-21 LAB — GLUCOSE, CAPILLARY: Glucose-Capillary: 111 mg/dL — ABNORMAL HIGH (ref 70–99)

## 2019-09-21 LAB — TYPE AND SCREEN
ABO/RH(D): A POS
Antibody Screen: NEGATIVE

## 2019-09-21 LAB — SARS CORONAVIRUS 2 (TAT 6-24 HRS): SARS Coronavirus 2: NEGATIVE

## 2019-09-21 LAB — APTT: aPTT: 25 seconds (ref 24–36)

## 2019-09-21 LAB — HEMOGLOBIN A1C
Hgb A1c MFr Bld: 5.9 % — ABNORMAL HIGH (ref 4.8–5.6)
Mean Plasma Glucose: 122.63 mg/dL

## 2019-09-21 NOTE — Progress Notes (Signed)
Pre-CABG Dopplers completed. Refer to "CV Proc" under chart review to view preliminary results.  09/21/2019 8:54 AM Eula Fried., MHA, RVT, RDCS, RDMS

## 2019-09-21 NOTE — Progress Notes (Signed)
PCP - Dr. Maurine Minister Cardiologist - denies  PPM/ICD - denies  Chest x-ray - 09/21/2019 EKG - 09/12/2019 Stress Test - 08/06/2019 ECHO - 09/21/2019 Cardiac Cath - 09/12/2019  Sleep Study - denies CPAP - N/A  DM: patient stated she was pre-diabetic CBG @ PAT appointment: 111  Blood Thinner Instructions: N/A Aspirin Instructions: Patient instructed to call surgeon's office today for instructions on when to stop aspirin for surgery  ERAS Protcol - No  COVID TEST- Scheduled for today 09/21/2019 after PAT appointment. Patient verbalized understanding of self-quarantine instructions, appointment time and place.  Anesthesia review: YES, heart hx/CAD  Patient denies shortness of breath, fever, cough and chest pain at PAT appointment  All instructions explained to the patient, with a verbal understanding of the material. Patient agrees to go over the instructions while at home for a better understanding. Patient also instructed to self quarantine after being tested for COVID-19. The opportunity to ask questions was provided.

## 2019-09-21 NOTE — Progress Notes (Signed)
Echocardiogram 2D Echocardiogram has been performed.  Karen Galloway Karen Galloway 09/21/2019, 9:36 AM

## 2019-09-24 MED ORDER — PHENYLEPHRINE HCL-NACL 20-0.9 MG/250ML-% IV SOLN
30.0000 ug/min | INTRAVENOUS | Status: AC
  Administered 2019-09-25: 25 ug/min via INTRAVENOUS
  Filled 2019-09-24: qty 250

## 2019-09-24 MED ORDER — SODIUM CHLORIDE 0.9 % IV SOLN
750.0000 mg | INTRAVENOUS | Status: AC
  Administered 2019-09-25: 750 mg via INTRAVENOUS
  Filled 2019-09-24: qty 750

## 2019-09-24 MED ORDER — SODIUM CHLORIDE 0.9 % IV SOLN
INTRAVENOUS | Status: DC
  Filled 2019-09-24: qty 30

## 2019-09-24 MED ORDER — MAGNESIUM SULFATE 50 % IJ SOLN
40.0000 meq | INTRAMUSCULAR | Status: DC
  Filled 2019-09-24: qty 9.85

## 2019-09-24 MED ORDER — EPINEPHRINE HCL 5 MG/250ML IV SOLN IN NS
0.0000 ug/min | INTRAVENOUS | Status: DC
  Filled 2019-09-24: qty 250

## 2019-09-24 MED ORDER — INSULIN REGULAR(HUMAN) IN NACL 100-0.9 UT/100ML-% IV SOLN
INTRAVENOUS | Status: AC
  Administered 2019-09-25: 1 [IU]/h via INTRAVENOUS
  Filled 2019-09-24: qty 100

## 2019-09-24 MED ORDER — TRANEXAMIC ACID (OHS) BOLUS VIA INFUSION
15.0000 mg/kg | INTRAVENOUS | Status: AC
  Administered 2019-09-25: 1591.5 mg via INTRAVENOUS
  Filled 2019-09-24: qty 1592

## 2019-09-24 MED ORDER — MILRINONE LACTATE IN DEXTROSE 20-5 MG/100ML-% IV SOLN
0.3000 ug/kg/min | INTRAVENOUS | Status: DC
  Filled 2019-09-24: qty 100

## 2019-09-24 MED ORDER — TRANEXAMIC ACID (OHS) PUMP PRIME SOLUTION
2.0000 mg/kg | INTRAVENOUS | Status: DC
  Filled 2019-09-24: qty 2.12

## 2019-09-24 MED ORDER — NITROGLYCERIN IN D5W 200-5 MCG/ML-% IV SOLN
2.0000 ug/min | INTRAVENOUS | Status: AC
  Administered 2019-09-25: 5 ug/min via INTRAVENOUS
  Filled 2019-09-24: qty 250

## 2019-09-24 MED ORDER — TRANEXAMIC ACID 1000 MG/10ML IV SOLN
1.5000 mg/kg/h | INTRAVENOUS | Status: AC
  Administered 2019-09-25: 1.5 mg/kg/h via INTRAVENOUS
  Filled 2019-09-24: qty 25

## 2019-09-24 MED ORDER — VANCOMYCIN HCL 1500 MG/300ML IV SOLN
1500.0000 mg | INTRAVENOUS | Status: AC
  Administered 2019-09-25: 1500 mg via INTRAVENOUS
  Filled 2019-09-24: qty 300

## 2019-09-24 MED ORDER — POTASSIUM CHLORIDE 2 MEQ/ML IV SOLN
80.0000 meq | INTRAVENOUS | Status: DC
  Filled 2019-09-24: qty 40

## 2019-09-24 MED ORDER — DEXMEDETOMIDINE HCL IN NACL 400 MCG/100ML IV SOLN
0.1000 ug/kg/h | INTRAVENOUS | Status: AC
  Administered 2019-09-25: .7 ug/kg/h via INTRAVENOUS
  Filled 2019-09-24: qty 100

## 2019-09-24 MED ORDER — NOREPINEPHRINE 4 MG/250ML-% IV SOLN
0.0000 ug/min | INTRAVENOUS | Status: DC
  Filled 2019-09-24: qty 250

## 2019-09-24 MED ORDER — PLASMA-LYTE 148 IV SOLN
INTRAVENOUS | Status: DC
  Filled 2019-09-24: qty 2.5

## 2019-09-24 MED ORDER — SODIUM CHLORIDE 0.9 % IV SOLN
1.5000 g | INTRAVENOUS | Status: AC
  Administered 2019-09-25: 1.5 g via INTRAVENOUS
  Filled 2019-09-24: qty 1.5

## 2019-09-25 ENCOUNTER — Inpatient Hospital Stay (HOSPITAL_COMMUNITY)
Admission: RE | Admit: 2019-09-25 | Discharge: 2019-09-30 | DRG: 236 | Disposition: A | Discharge status: Home / Self Care | Payer: 59 | Attending: Cardiothoracic Surgery | Admitting: Cardiothoracic Surgery

## 2019-09-25 ENCOUNTER — Encounter (HOSPITAL_COMMUNITY): Payer: Self-pay | Admitting: Cardiothoracic Surgery

## 2019-09-25 ENCOUNTER — Inpatient Hospital Stay (HOSPITAL_COMMUNITY)
Admission: RE | Disposition: A | Discharge status: Home / Self Care | Payer: Self-pay | Source: Home / Self Care | Attending: Cardiothoracic Surgery

## 2019-09-25 ENCOUNTER — Inpatient Hospital Stay (HOSPITAL_COMMUNITY): Payer: 59

## 2019-09-25 ENCOUNTER — Other Ambulatory Visit: Payer: Self-pay

## 2019-09-25 ENCOUNTER — Inpatient Hospital Stay (HOSPITAL_COMMUNITY): Payer: 59 | Admitting: Anesthesiology

## 2019-09-25 DIAGNOSIS — Z951 Presence of aortocoronary bypass graft: Secondary | ICD-10-CM

## 2019-09-25 DIAGNOSIS — I480 Paroxysmal atrial fibrillation: Secondary | ICD-10-CM | POA: Diagnosis present

## 2019-09-25 DIAGNOSIS — K219 Gastro-esophageal reflux disease without esophagitis: Secondary | ICD-10-CM | POA: Diagnosis present

## 2019-09-25 DIAGNOSIS — Z888 Allergy status to other drugs, medicaments and biological substances status: Secondary | ICD-10-CM | POA: Diagnosis not present

## 2019-09-25 DIAGNOSIS — R7303 Prediabetes: Secondary | ICD-10-CM | POA: Diagnosis present

## 2019-09-25 DIAGNOSIS — E876 Hypokalemia: Secondary | ICD-10-CM | POA: Diagnosis not present

## 2019-09-25 DIAGNOSIS — R000 Tachycardia, unspecified: Secondary | ICD-10-CM | POA: Diagnosis not present

## 2019-09-25 DIAGNOSIS — J9811 Atelectasis: Secondary | ICD-10-CM | POA: Diagnosis not present

## 2019-09-25 DIAGNOSIS — E669 Obesity, unspecified: Secondary | ICD-10-CM | POA: Diagnosis present

## 2019-09-25 DIAGNOSIS — D62 Acute posthemorrhagic anemia: Secondary | ICD-10-CM | POA: Diagnosis not present

## 2019-09-25 DIAGNOSIS — Z6838 Body mass index (BMI) 38.0-38.9, adult: Secondary | ICD-10-CM

## 2019-09-25 DIAGNOSIS — I10 Essential (primary) hypertension: Secondary | ICD-10-CM | POA: Diagnosis present

## 2019-09-25 DIAGNOSIS — Z8679 Personal history of other diseases of the circulatory system: Secondary | ICD-10-CM

## 2019-09-25 DIAGNOSIS — Z9889 Other specified postprocedural states: Secondary | ICD-10-CM

## 2019-09-25 DIAGNOSIS — I25119 Atherosclerotic heart disease of native coronary artery with unspecified angina pectoris: Secondary | ICD-10-CM | POA: Diagnosis present

## 2019-09-25 DIAGNOSIS — I2510 Atherosclerotic heart disease of native coronary artery without angina pectoris: Secondary | ICD-10-CM | POA: Diagnosis present

## 2019-09-25 DIAGNOSIS — I959 Hypotension, unspecified: Secondary | ICD-10-CM | POA: Diagnosis not present

## 2019-09-25 DIAGNOSIS — J939 Pneumothorax, unspecified: Secondary | ICD-10-CM

## 2019-09-25 DIAGNOSIS — E8770 Fluid overload, unspecified: Secondary | ICD-10-CM | POA: Diagnosis not present

## 2019-09-25 HISTORY — PX: RADIAL ARTERY HARVEST: SHX5067

## 2019-09-25 HISTORY — PX: CORONARY ARTERY BYPASS GRAFT: SHX141

## 2019-09-25 HISTORY — PX: MAZE: SHX5063

## 2019-09-25 HISTORY — PX: TEE WITHOUT CARDIOVERSION: SHX5443

## 2019-09-25 HISTORY — PX: CLIPPING OF ATRIAL APPENDAGE: SHX5773

## 2019-09-25 LAB — BASIC METABOLIC PANEL
Anion gap: 9 (ref 5–15)
BUN: 9 mg/dL (ref 6–20)
CO2: 22 mmol/L (ref 22–32)
Calcium: 6.9 mg/dL — ABNORMAL LOW (ref 8.9–10.3)
Chloride: 107 mmol/L (ref 98–111)
Creatinine, Ser: 0.55 mg/dL (ref 0.44–1.00)
GFR calc Af Amer: >60 mL/min (ref >60)
GFR calc non Af Amer: >60 mL/min (ref >60)
Glucose, Bld: 148 mg/dL — ABNORMAL HIGH (ref 70–99)
Potassium: 3.7 mmol/L (ref 3.5–5.1)
Sodium: 138 mmol/L (ref 135–145)

## 2019-09-25 LAB — GLUCOSE, CAPILLARY
Glucose-Capillary: 93 mg/dL (ref 70–99)
Glucose-Capillary: 114 mg/dL — ABNORMAL HIGH (ref 70–99)
Glucose-Capillary: 127 mg/dL — ABNORMAL HIGH (ref 70–99)
Glucose-Capillary: 148 mg/dL — ABNORMAL HIGH (ref 70–99)
Glucose-Capillary: 150 mg/dL — ABNORMAL HIGH (ref 70–99)
Glucose-Capillary: 148 mg/dL — ABNORMAL HIGH (ref 70–99)
Glucose-Capillary: 137 mg/dL — ABNORMAL HIGH (ref 70–99)
Glucose-Capillary: 140 mg/dL — ABNORMAL HIGH (ref 70–99)
Glucose-Capillary: 167 mg/dL — ABNORMAL HIGH (ref 70–99)
Glucose-Capillary: 149 mg/dL — ABNORMAL HIGH (ref 70–99)
Glucose-Capillary: 134 mg/dL — ABNORMAL HIGH (ref 70–99)

## 2019-09-25 LAB — POCT I-STAT 7, (LYTES, BLD GAS, ICA,H+H)
Acid-base deficit: 4 mmol/L — ABNORMAL HIGH (ref 0.0–2.0)
Acid-base deficit: 4 mmol/L — ABNORMAL HIGH (ref 0.0–2.0)
Acid-base deficit: 2 mmol/L (ref 0.0–2.0)
Acid-base deficit: 3 mmol/L — ABNORMAL HIGH (ref 0.0–2.0)
Bicarbonate: 23.1 mmol/L (ref 20.0–28.0)
Bicarbonate: 22 mmol/L (ref 20.0–28.0)
Bicarbonate: 23.4 mmol/L (ref 20.0–28.0)
Bicarbonate: 23.1 mmol/L (ref 20.0–28.0)
Calcium, Ion: 1 mmol/L — ABNORMAL LOW (ref 1.15–1.40)
Calcium, Ion: 1.01 mmol/L — ABNORMAL LOW (ref 1.15–1.40)
Calcium, Ion: 0.95 mmol/L — ABNORMAL LOW (ref 1.15–1.40)
Calcium, Ion: 0.97 mmol/L — ABNORMAL LOW (ref 1.15–1.40)
HCT: 33 % — ABNORMAL LOW (ref 36.0–46.0)
HCT: 33 % — ABNORMAL LOW (ref 36.0–46.0)
HCT: 32 % — ABNORMAL LOW (ref 36.0–46.0)
HCT: 29 % — ABNORMAL LOW (ref 36.0–46.0)
Hemoglobin: 11.2 g/dL — ABNORMAL LOW (ref 12.0–15.0)
Hemoglobin: 11.2 g/dL — ABNORMAL LOW (ref 12.0–15.0)
Hemoglobin: 10.9 g/dL — ABNORMAL LOW (ref 12.0–15.0)
Hemoglobin: 9.9 g/dL — ABNORMAL LOW (ref 12.0–15.0)
O2 Saturation: 99 %
O2 Saturation: 98 %
O2 Saturation: 99 %
O2 Saturation: 99 %
Patient temperature: 36.1
Patient temperature: 36.9
Patient temperature: 37.4
Patient temperature: 37.5
Potassium: 4.3 mmol/L (ref 3.5–5.1)
Potassium: 4.2 mmol/L (ref 3.5–5.1)
Potassium: 3.9 mmol/L (ref 3.5–5.1)
Potassium: 3.8 mmol/L (ref 3.5–5.1)
Sodium: 140 mmol/L (ref 135–145)
Sodium: 141 mmol/L (ref 135–145)
Sodium: 142 mmol/L (ref 135–145)
Sodium: 142 mmol/L (ref 135–145)
TCO2: 25 mmol/L (ref 22–32)
TCO2: 23 mmol/L (ref 22–32)
TCO2: 25 mmol/L (ref 22–32)
TCO2: 24 mmol/L (ref 22–32)
pCO2 arterial: 46.6 mmHg (ref 32.0–48.0)
pCO2 arterial: 43.9 mmHg (ref 32.0–48.0)
pCO2 arterial: 40.5 mmHg (ref 32.0–48.0)
pCO2 arterial: 44.4 mmHg (ref 32.0–48.0)
pH, Arterial: 7.3 — ABNORMAL LOW (ref 7.350–7.450)
pH, Arterial: 7.308 — ABNORMAL LOW (ref 7.350–7.450)
pH, Arterial: 7.372 (ref 7.350–7.450)
pH, Arterial: 7.326 — ABNORMAL LOW (ref 7.350–7.450)
pO2, Arterial: 170 mmHg — ABNORMAL HIGH (ref 83.0–108.0)
pO2, Arterial: 124 mmHg — ABNORMAL HIGH (ref 83.0–108.0)
pO2, Arterial: 132 mmHg — ABNORMAL HIGH (ref 83.0–108.0)
pO2, Arterial: 130 mmHg — ABNORMAL HIGH (ref 83.0–108.0)

## 2019-09-25 LAB — HEMOGLOBIN AND HEMATOCRIT, BLOOD
HCT: 27.9 % — ABNORMAL LOW (ref 36.0–46.0)
Hemoglobin: 9.1 g/dL — ABNORMAL LOW (ref 12.0–15.0)

## 2019-09-25 LAB — CBC
HCT: 32.1 % — ABNORMAL LOW (ref 36.0–46.0)
HCT: 30 % — ABNORMAL LOW (ref 36.0–46.0)
Hemoglobin: 10.6 g/dL — ABNORMAL LOW (ref 12.0–15.0)
Hemoglobin: 9.8 g/dL — ABNORMAL LOW (ref 12.0–15.0)
MCH: 30 pg (ref 26.0–34.0)
MCH: 29.7 pg (ref 26.0–34.0)
MCHC: 33 g/dL (ref 30.0–36.0)
MCHC: 32.7 g/dL (ref 30.0–36.0)
MCV: 90.9 fL (ref 80.0–100.0)
MCV: 90.9 fL (ref 80.0–100.0)
Platelets: 182 10*3/uL (ref 150–400)
Platelets: 133 10*3/uL — ABNORMAL LOW (ref 150–400)
RBC: 3.53 MIL/uL — ABNORMAL LOW (ref 3.87–5.11)
RBC: 3.3 MIL/uL — ABNORMAL LOW (ref 3.87–5.11)
RDW: 12.7 % (ref 11.5–15.5)
RDW: 12.8 % (ref 11.5–15.5)
WBC: 15.5 10*3/uL — ABNORMAL HIGH (ref 4.0–10.5)
WBC: 9.2 10*3/uL (ref 4.0–10.5)
nRBC: 0 % (ref 0.0–0.2)
nRBC: 0 % (ref 0.0–0.2)

## 2019-09-25 LAB — MAGNESIUM: Magnesium: 2.6 mg/dL — ABNORMAL HIGH (ref 1.7–2.4)

## 2019-09-25 LAB — APTT: aPTT: 27 seconds (ref 24–36)

## 2019-09-25 LAB — PROTIME-INR
INR: 1.6 — ABNORMAL HIGH (ref 0.8–1.2)
Prothrombin Time: 18.3 seconds — ABNORMAL HIGH (ref 11.4–15.2)

## 2019-09-25 LAB — PLATELET COUNT: Platelets: 204 10*3/uL (ref 150–400)

## 2019-09-25 LAB — ABO/RH: ABO/RH(D): A POS

## 2019-09-25 SURGERY — CORONARY ARTERY BYPASS GRAFTING (CABG)
Anesthesia: General | Site: Chest

## 2019-09-25 MED ORDER — SODIUM CHLORIDE 0.9% FLUSH: 3.0000 mL | INTRAVENOUS | Status: DC | PRN

## 2019-09-25 MED ORDER — LIDOCAINE 2% (20 MG/ML) 5 ML SYRINGE: INTRAMUSCULAR | Status: DC | PRN

## 2019-09-25 MED ORDER — METOPROLOL TARTRATE 5 MG/5ML IV SOLN: 2.5000 mg | INTRAVENOUS | Status: DC | PRN

## 2019-09-25 MED ORDER — LACTATED RINGERS IV SOLN: INTRAVENOUS | Status: DC

## 2019-09-25 MED ORDER — POTASSIUM CHLORIDE 10 MEQ/50ML IV SOLN: 10.0000 meq | INTRAVENOUS | Status: AC

## 2019-09-25 MED ORDER — CHLORHEXIDINE GLUCONATE 0.12 % MT SOLN: 15.0000 mL | Freq: Once | OROMUCOSAL | Status: DC

## 2019-09-25 MED ORDER — SODIUM CHLORIDE 0.9 % IV SOLN: 250.0000 mL | INTRAVENOUS | Status: DC

## 2019-09-25 MED ORDER — ASPIRIN EC 325 MG PO TBEC: 325.0000 mg | DELAYED_RELEASE_TABLET | Freq: Every day | ORAL | Status: DC

## 2019-09-25 MED ORDER — PLASMA-LYTE 148 IV SOLN
INTRAVENOUS | Status: DC | PRN
  Administered 2019-09-25: 500 mL

## 2019-09-25 MED ORDER — ALBUMIN HUMAN 5 % IV SOLN
250.0000 mL | INTRAVENOUS | Status: AC | PRN
  Administered 2019-09-25 (×4): 12.5 g via INTRAVENOUS
  Filled 2019-09-25 (×2): qty 250

## 2019-09-25 MED ORDER — ASPIRIN 81 MG PO CHEW: 324.0000 mg | CHEWABLE_TABLET | Freq: Every day | ORAL | Status: DC

## 2019-09-25 MED ORDER — HEMOSTATIC AGENTS (NO CHARGE) OPTIME
TOPICAL | Status: DC | PRN
  Administered 2019-09-25 (×2): 1 via TOPICAL

## 2019-09-25 MED ORDER — PROTAMINE SULFATE 10 MG/ML IV SOLN
INTRAVENOUS | Status: DC | PRN
  Administered 2019-09-25: 30 mg via INTRAVENOUS

## 2019-09-25 MED ORDER — ATORVASTATIN CALCIUM 10 MG PO TABS
10.0000 mg | ORAL_TABLET | Freq: Every day | ORAL | Status: DC
  Administered 2019-09-25 – 2019-09-29 (×5): 10 mg via ORAL
  Filled 2019-09-25 (×5): qty 1

## 2019-09-25 MED ORDER — BUPIVACAINE LIPOSOME 1.3 % IJ SUSP
INTRAMUSCULAR | Status: DC | PRN
  Administered 2019-09-25: 50 mL

## 2019-09-25 MED ORDER — BISACODYL 10 MG RE SUPP: 10.0000 mg | Freq: Every day | RECTAL | Status: DC

## 2019-09-25 MED ORDER — SODIUM CHLORIDE (PF) 0.9 % IJ SOLN
OROMUCOSAL | Status: DC | PRN
  Administered 2019-09-25 (×2): 4 mL via TOPICAL

## 2019-09-25 MED ORDER — SODIUM CHLORIDE 0.9% FLUSH: 10.0000 mL | INTRAVENOUS | Status: DC | PRN

## 2019-09-25 MED ORDER — VANCOMYCIN HCL 1000 MG IV SOLR
INTRAVENOUS | Status: AC
  Filled 2019-09-25: qty 3000

## 2019-09-25 MED ORDER — SODIUM BICARBONATE 8.4 % IV SOLN
50.0000 meq | Freq: Once | INTRAVENOUS | Status: AC
  Administered 2019-09-25: 50 meq via INTRAVENOUS

## 2019-09-25 MED ORDER — SODIUM CHLORIDE 0.9 % IV SOLN: INTRAVENOUS | Status: DC

## 2019-09-25 MED ORDER — PROTAMINE SULFATE 10 MG/ML IV SOLN
INTRAVENOUS | Status: AC
  Filled 2019-09-25: qty 25

## 2019-09-25 MED ORDER — SODIUM CHLORIDE 0.9% FLUSH
10.0000 mL | Freq: Two times a day (BID) | INTRAVENOUS | Status: DC
  Administered 2019-09-25 – 2019-09-27 (×5): 10 mL
  Administered 2019-09-27: 20 mL
  Administered 2019-09-28: 10 mL

## 2019-09-25 MED ORDER — HEPARIN SODIUM (PORCINE) 1000 UNIT/ML IJ SOLN
INTRAMUSCULAR | Status: DC | PRN
  Administered 2019-09-25: 38000 [IU] via INTRAVENOUS

## 2019-09-25 MED ORDER — MIDAZOLAM HCL (PF) 10 MG/2ML IJ SOLN
INTRAMUSCULAR | Status: AC
  Filled 2019-09-25: qty 2

## 2019-09-25 MED ORDER — PROPOFOL 10 MG/ML IV BOLUS
INTRAVENOUS | Status: AC
  Filled 2019-09-25: qty 20

## 2019-09-25 MED ORDER — INSULIN REGULAR(HUMAN) IN NACL 100-0.9 UT/100ML-% IV SOLN: INTRAVENOUS | Status: DC

## 2019-09-25 MED ORDER — 0.9 % SODIUM CHLORIDE (POUR BTL) OPTIME
TOPICAL | Status: DC | PRN
  Administered 2019-09-25: 5000 mL

## 2019-09-25 MED ORDER — SODIUM CHLORIDE 0.9 % IV SOLN
1.5000 g | Freq: Two times a day (BID) | INTRAVENOUS | Status: AC
  Administered 2019-09-25 – 2019-09-27 (×4): 1.5 g via INTRAVENOUS
  Filled 2019-09-25 (×4): qty 1.5

## 2019-09-25 MED ORDER — BISACODYL 5 MG PO TBEC
10.0000 mg | DELAYED_RELEASE_TABLET | Freq: Every day | ORAL | Status: DC
  Administered 2019-09-26 – 2019-09-28 (×3): 10 mg via ORAL
  Filled 2019-09-25 (×3): qty 2

## 2019-09-25 MED ORDER — MORPHINE SULFATE (PF) 2 MG/ML IV SOLN
1.0000 mg | INTRAVENOUS | Status: DC | PRN
  Administered 2019-09-25: 4 mg via INTRAVENOUS
  Administered 2019-09-25: 2 mg via INTRAVENOUS
  Administered 2019-09-25: 4 mg via INTRAVENOUS
  Administered 2019-09-25: 2 mg via INTRAVENOUS
  Administered 2019-09-26 (×2): 4 mg via INTRAVENOUS
  Administered 2019-09-26: 2 mg via INTRAVENOUS
  Administered 2019-09-27: 4 mg via INTRAVENOUS
  Administered 2019-09-27: 2 mg via INTRAVENOUS
  Administered 2019-09-27: 4 mg via INTRAVENOUS
  Filled 2019-09-25 (×2): qty 1
  Filled 2019-09-25 (×4): qty 2
  Filled 2019-09-25: qty 1
  Filled 2019-09-25 (×2): qty 2
  Filled 2019-09-25: qty 1

## 2019-09-25 MED ORDER — POTASSIUM CHLORIDE CRYS ER 20 MEQ PO TBCR
20.0000 meq | EXTENDED_RELEASE_TABLET | ORAL | Status: AC
  Administered 2019-09-26 (×3): 20 meq via ORAL
  Filled 2019-09-25 (×3): qty 1

## 2019-09-25 MED ORDER — CHLORHEXIDINE GLUCONATE CLOTH 2 % EX PADS
6.0000 | MEDICATED_PAD | Freq: Every day | CUTANEOUS | Status: DC
  Administered 2019-09-25: 6 via TOPICAL

## 2019-09-25 MED ORDER — FENTANYL CITRATE (PF) 250 MCG/5ML IJ SOLN
INTRAMUSCULAR | Status: AC
  Filled 2019-09-25: qty 25

## 2019-09-25 MED ORDER — STERILE WATER FOR INJECTION IV SOLN
INTRAVENOUS | Status: DC | PRN
  Administered 2019-09-25: 30 mL

## 2019-09-25 MED ORDER — NITROGLYCERIN IN D5W 200-5 MCG/ML-% IV SOLN: 0.0000 ug/min | INTRAVENOUS | Status: DC

## 2019-09-25 MED ORDER — LACTATED RINGERS IV SOLN: INTRAVENOUS | Status: DC | PRN

## 2019-09-25 MED ORDER — FENTANYL CITRATE (PF) 250 MCG/5ML IJ SOLN
INTRAMUSCULAR | Status: DC | PRN
  Administered 2019-09-25: 100 ug via INTRAVENOUS
  Administered 2019-09-25: 50 ug via INTRAVENOUS
  Administered 2019-09-25 (×4): 100 ug via INTRAVENOUS
  Administered 2019-09-25: 50 ug via INTRAVENOUS
  Administered 2019-09-25: 100 ug via INTRAVENOUS
  Administered 2019-09-25 (×2): 50 ug via INTRAVENOUS
  Administered 2019-09-25 (×2): 100 ug via INTRAVENOUS
  Administered 2019-09-25: 50 ug via INTRAVENOUS
  Administered 2019-09-25 (×2): 100 ug via INTRAVENOUS

## 2019-09-25 MED ORDER — MIDAZOLAM HCL 5 MG/5ML IJ SOLN
INTRAMUSCULAR | Status: DC | PRN
  Administered 2019-09-25 (×3): 2 mg via INTRAVENOUS
  Administered 2019-09-25: 1 mg via INTRAVENOUS
  Administered 2019-09-25: 3 mg via INTRAVENOUS

## 2019-09-25 MED ORDER — PANTOPRAZOLE SODIUM 40 MG PO TBEC
40.0000 mg | DELAYED_RELEASE_TABLET | Freq: Every day | ORAL | Status: DC
  Administered 2019-09-27 – 2019-09-30 (×4): 40 mg via ORAL
  Filled 2019-09-25 (×4): qty 1

## 2019-09-25 MED ORDER — ROCURONIUM BROMIDE 10 MG/ML (PF) SYRINGE
PREFILLED_SYRINGE | INTRAVENOUS | Status: AC
  Filled 2019-09-25: qty 20

## 2019-09-25 MED ORDER — PROTAMINE SULFATE 10 MG/ML IV SOLN
INTRAVENOUS | Status: AC
  Filled 2019-09-25: qty 15

## 2019-09-25 MED ORDER — CHLORHEXIDINE GLUCONATE 0.12% ORAL RINSE (MEDLINE KIT): 15.0000 mL | Freq: Two times a day (BID) | OROMUCOSAL | Status: DC

## 2019-09-25 MED ORDER — ACETAMINOPHEN 160 MG/5ML PO SOLN: 650.0000 mg | Freq: Once | ORAL | Status: AC

## 2019-09-25 MED ORDER — OXYCODONE HCL 5 MG PO TABS
5.0000 mg | ORAL_TABLET | ORAL | Status: DC | PRN
  Administered 2019-09-25 – 2019-09-29 (×14): 5 mg via ORAL
  Filled 2019-09-25 (×14): qty 1

## 2019-09-25 MED ORDER — ONDANSETRON HCL 4 MG/2ML IJ SOLN
4.0000 mg | Freq: Four times a day (QID) | INTRAMUSCULAR | Status: DC | PRN
  Administered 2019-09-25 – 2019-09-30 (×4): 4 mg via INTRAVENOUS
  Filled 2019-09-25 (×4): qty 2

## 2019-09-25 MED ORDER — ORAL CARE MOUTH RINSE
15.0000 mL | Freq: Two times a day (BID) | OROMUCOSAL | Status: DC
  Administered 2019-09-25 – 2019-09-28 (×7): 15 mL via OROMUCOSAL

## 2019-09-25 MED ORDER — ALBUMIN HUMAN 5 % IV SOLN: INTRAVENOUS | Status: DC | PRN

## 2019-09-25 MED ORDER — PROPOFOL 10 MG/ML IV BOLUS
INTRAVENOUS | Status: DC | PRN
  Administered 2019-09-25: 50 mg via INTRAVENOUS
  Administered 2019-09-25: 150 mg via INTRAVENOUS

## 2019-09-25 MED ORDER — DEXMEDETOMIDINE HCL IN NACL 400 MCG/100ML IV SOLN
0.0000 ug/kg/h | INTRAVENOUS | Status: DC
  Administered 2019-09-26: 0.1 ug/kg/h via INTRAVENOUS
  Filled 2019-09-25: qty 100

## 2019-09-25 MED ORDER — METOPROLOL TARTRATE 12.5 MG HALF TABLET: 12.5000 mg | ORAL_TABLET | Freq: Once | ORAL | Status: DC

## 2019-09-25 MED ORDER — HEPARIN SODIUM (PORCINE) 1000 UNIT/ML IJ SOLN
INTRAMUSCULAR | Status: AC
  Filled 2019-09-25: qty 1

## 2019-09-25 MED ORDER — LACTATED RINGERS IV SOLN: 500.0000 mL | Freq: Once | INTRAVENOUS | Status: DC | PRN

## 2019-09-25 MED ORDER — ORAL CARE MOUTH RINSE
15.0000 mL | OROMUCOSAL | Status: DC
  Administered 2019-09-25: 15 mL via OROMUCOSAL

## 2019-09-25 MED ORDER — ACETAMINOPHEN 160 MG/5ML PO SOLN: 1000.0000 mg | Freq: Four times a day (QID) | ORAL | Status: DC

## 2019-09-25 MED ORDER — ACETAMINOPHEN 500 MG PO TABS
1000.0000 mg | ORAL_TABLET | Freq: Four times a day (QID) | ORAL | Status: DC
  Administered 2019-09-26 – 2019-09-30 (×17): 1000 mg via ORAL
  Filled 2019-09-25 (×17): qty 2

## 2019-09-25 MED ORDER — SODIUM CHLORIDE 0.9 % IV SOLN
INTRAVENOUS | Status: DC | PRN
Start: 2019-09-25 — End: 2019-09-25

## 2019-09-25 MED ORDER — PHENYLEPHRINE HCL-NACL 20-0.9 MG/250ML-% IV SOLN: 0.0000 ug/min | INTRAVENOUS | Status: DC

## 2019-09-25 MED ORDER — METOPROLOL TARTRATE 25 MG/10 ML ORAL SUSPENSION: 12.5000 mg | Freq: Two times a day (BID) | ORAL | Status: DC

## 2019-09-25 MED ORDER — ACETAMINOPHEN 650 MG RE SUPP
650.0000 mg | Freq: Once | RECTAL | Status: AC
  Administered 2019-09-25: 650 mg via RECTAL

## 2019-09-25 MED ORDER — METOPROLOL TARTRATE 12.5 MG HALF TABLET
12.5000 mg | ORAL_TABLET | Freq: Two times a day (BID) | ORAL | Status: DC
  Administered 2019-09-26 – 2019-09-30 (×9): 12.5 mg via ORAL
  Filled 2019-09-25 (×10): qty 1

## 2019-09-25 MED ORDER — ADULT MULTIVITAMIN W/MINERALS CH
1.0000 | ORAL_TABLET | Freq: Every day | ORAL | Status: DC
  Administered 2019-09-26 – 2019-09-30 (×5): 1 via ORAL
  Filled 2019-09-25 (×5): qty 1

## 2019-09-25 MED ORDER — NITROGLYCERIN 0.2 MG/ML ON CALL CATH LAB
INTRAVENOUS | Status: DC | PRN
Start: 2019-09-25 — End: 2019-09-25
  Administered 2019-09-25: 40 ug via INTRAVENOUS

## 2019-09-25 MED ORDER — CHLORHEXIDINE GLUCONATE 0.12 % MT SOLN
15.0000 mL | Freq: Once | OROMUCOSAL | Status: AC
  Administered 2019-09-25: 15 mL via OROMUCOSAL
  Filled 2019-09-25: qty 15

## 2019-09-25 MED ORDER — MAGNESIUM SULFATE 4 GM/100ML IV SOLN
4.0000 g | Freq: Once | INTRAVENOUS | Status: AC
  Administered 2019-09-25: 4 g via INTRAVENOUS

## 2019-09-25 MED ORDER — STERILE WATER FOR INJECTION IJ SOLN
INTRAMUSCULAR | Status: AC
  Filled 2019-09-25: qty 10

## 2019-09-25 MED ORDER — TRAMADOL HCL 50 MG PO TABS
50.0000 mg | ORAL_TABLET | ORAL | Status: DC | PRN
  Administered 2019-09-25 – 2019-09-30 (×9): 50 mg via ORAL
  Filled 2019-09-25 (×9): qty 1

## 2019-09-25 MED ORDER — CHLORHEXIDINE GLUCONATE 4 % EX LIQD: 30.0000 mL | CUTANEOUS | Status: DC

## 2019-09-25 MED ORDER — VANCOMYCIN HCL IN DEXTROSE 1-5 GM/200ML-% IV SOLN
1000.0000 mg | Freq: Once | INTRAVENOUS | Status: AC
  Administered 2019-09-25: 1000 mg via INTRAVENOUS
  Filled 2019-09-25: qty 200

## 2019-09-25 MED ORDER — BUPIVACAINE LIPOSOME 1.3 % IJ SUSP
20.0000 mL | Freq: Once | INTRAMUSCULAR | Status: DC
  Filled 2019-09-25: qty 20

## 2019-09-25 MED ORDER — SODIUM CHLORIDE 0.9% FLUSH
3.0000 mL | Freq: Two times a day (BID) | INTRAVENOUS | Status: DC
  Administered 2019-09-26 – 2019-09-28 (×4): 3 mL via INTRAVENOUS

## 2019-09-25 MED ORDER — DEXTROSE 50 % IV SOLN: 0.0000 mL | INTRAVENOUS | Status: DC | PRN

## 2019-09-25 MED ORDER — CHLORHEXIDINE GLUCONATE CLOTH 2 % EX PADS
6.0000 | MEDICATED_PAD | Freq: Every day | CUTANEOUS | Status: DC
  Administered 2019-09-26 – 2019-09-29 (×4): 6 via TOPICAL

## 2019-09-25 MED ORDER — ROCURONIUM BROMIDE 10 MG/ML (PF) SYRINGE
PREFILLED_SYRINGE | INTRAVENOUS | Status: DC | PRN
  Administered 2019-09-25: 30 mg via INTRAVENOUS
  Administered 2019-09-25: 80 mg via INTRAVENOUS
  Administered 2019-09-25: 20 mg via INTRAVENOUS
  Administered 2019-09-25: 50 mg via INTRAVENOUS
  Administered 2019-09-25: 20 mg via INTRAVENOUS

## 2019-09-25 MED ORDER — VANCOMYCIN HCL 1000 MG IV SOLR
INTRAVENOUS | Status: DC | PRN
  Administered 2019-09-25: 3 g via TOPICAL

## 2019-09-25 MED ORDER — ORAL CARE MOUTH RINSE: 15.0000 mL | Freq: Once | OROMUCOSAL | Status: AC

## 2019-09-25 MED ORDER — CHLORHEXIDINE GLUCONATE 0.12 % MT SOLN
15.0000 mL | OROMUCOSAL | Status: AC
  Administered 2019-09-25: 15 mL via OROMUCOSAL

## 2019-09-25 MED ORDER — BUPIVACAINE HCL (PF) 0.5 % IJ SOLN
INTRAMUSCULAR | Status: AC
  Filled 2019-09-25: qty 30

## 2019-09-25 MED ORDER — SODIUM CHLORIDE 0.45 % IV SOLN: INTRAVENOUS | Status: DC | PRN

## 2019-09-25 MED ORDER — MIDAZOLAM HCL 2 MG/2ML IJ SOLN: 2.0000 mg | INTRAMUSCULAR | Status: DC | PRN

## 2019-09-25 MED ORDER — DOCUSATE SODIUM 100 MG PO CAPS
200.0000 mg | ORAL_CAPSULE | Freq: Every day | ORAL | Status: DC
  Administered 2019-09-26 – 2019-09-30 (×5): 200 mg via ORAL
  Filled 2019-09-25 (×5): qty 2

## 2019-09-25 MED ORDER — FAMOTIDINE IN NACL 20-0.9 MG/50ML-% IV SOLN
20.0000 mg | Freq: Two times a day (BID) | INTRAVENOUS | Status: DC
  Administered 2019-09-25: 20 mg via INTRAVENOUS

## 2019-09-25 SURGICAL SUPPLY — 124 items
ADAPTER CARDIO PERF ANTE/RETRO (ADAPTER) ×5 IMPLANT
APPLICATOR TIP COSEAL (VASCULAR PRODUCTS) ×5 IMPLANT
APPLIER CLIP 9.375 SM OPEN (CLIP) ×5
ATRICLIP EXCLUSION VLAA 45 (Miscellaneous) ×5 IMPLANT
BAG DECANTER FOR FLEXI CONT (MISCELLANEOUS) ×5 IMPLANT
BATTERY MAXDRIVER (MISCELLANEOUS) ×5 IMPLANT
BENZOIN TINCTURE PRP APPL 2/3 (GAUZE/BANDAGES/DRESSINGS) ×10 IMPLANT
BLADE CLIPPER SURG (BLADE) ×5 IMPLANT
BLADE STERNUM SYSTEM 6 (BLADE) ×5 IMPLANT
BLADE SURG 11 STRL SS (BLADE) ×5 IMPLANT
BLADE SURG 15 STRL LF DISP TIS (BLADE) ×4 IMPLANT
BLADE SURG 15 STRL SS (BLADE) ×1
BNDG ELASTIC 4X5.8 VLCR STR LF (GAUZE/BANDAGES/DRESSINGS) ×10 IMPLANT
BNDG ELASTIC 6X5.8 VLCR STR LF (GAUZE/BANDAGES/DRESSINGS) ×5 IMPLANT
BNDG GAUZE ELAST 4 BULKY (GAUZE/BANDAGES/DRESSINGS) ×10 IMPLANT
CANISTER SUCT 3000ML PPV (MISCELLANEOUS) ×5 IMPLANT
CANISTER WOUND CARE 500ML ATS (WOUND CARE) ×5 IMPLANT
CANNULA NON VENT 22FR 12 (CANNULA) ×5 IMPLANT
CATH CPB KIT HENDRICKSON (MISCELLANEOUS) ×5 IMPLANT
CATH ROBINSON RED A/P 18FR (CATHETERS) ×15 IMPLANT
CLAMP ISOLATOR SYNERGY LG (MISCELLANEOUS) ×5 IMPLANT
CLIP APPLIE 9.375 SM OPEN (CLIP) ×4 IMPLANT
CLIP RETRACTION 3.0MM CORONARY (MISCELLANEOUS) ×5 IMPLANT
CLIP VESOCCLUDE MED 24/CT (CLIP) ×5 IMPLANT
CLIP VESOCCLUDE SM WIDE 24/CT (CLIP) IMPLANT
CONN ST 1/4X3/8  BEN (MISCELLANEOUS) ×1
CONN ST 1/4X3/8 BEN (MISCELLANEOUS) ×4 IMPLANT
COVER MAYO STAND STRL (DRAPES) ×5 IMPLANT
CUFF TOURN SGL QUICK 18X4 (TOURNIQUET CUFF) IMPLANT
CUFF TOURN SGL QUICK 24 (TOURNIQUET CUFF)
CUFF TRNQT CYL 24X4X16.5-23 (TOURNIQUET CUFF) IMPLANT
DERMABOND ADVANCED (GAUZE/BANDAGES/DRESSINGS) ×1
DERMABOND ADVANCED .7 DNX12 (GAUZE/BANDAGES/DRESSINGS) ×4 IMPLANT
DEVICE EXCLUSIN ATRCLP VLAA 45 (Miscellaneous) ×4 IMPLANT
DRAIN CHANNEL 28F RND 3/8 FF (WOUND CARE) ×15 IMPLANT
DRAPE CARDIOVASCULAR INCISE (DRAPES) ×1
DRAPE EXTREMITY T 121X128X90 (DISPOSABLE) ×5 IMPLANT
DRAPE HALF SHEET 40X57 (DRAPES) ×5 IMPLANT
DRAPE SLUSH/WARMER DISC (DRAPES) ×5 IMPLANT
DRAPE SRG 135X102X78XABS (DRAPES) ×4 IMPLANT
DRESSING PEEL AND PLAC PRVNA20 (GAUZE/BANDAGES/DRESSINGS) ×4 IMPLANT
DRSG AQUACEL AG ADV 3.5X14 (GAUZE/BANDAGES/DRESSINGS) IMPLANT
DRSG PEEL AND PLACE PREVENA 20 (GAUZE/BANDAGES/DRESSINGS) ×5
ELECT CAUTERY BLADE 6.4 (BLADE) ×5 IMPLANT
ELECT REM PT RETURN 9FT ADLT (ELECTROSURGICAL) ×10
ELECTRODE REM PT RTRN 9FT ADLT (ELECTROSURGICAL) ×8 IMPLANT
FELT TEFLON 1X6 (MISCELLANEOUS) ×5 IMPLANT
GAUZE SPONGE 4X4 12PLY STRL (GAUZE/BANDAGES/DRESSINGS) ×10 IMPLANT
GAUZE SPONGE 4X4 12PLY STRL LF (GAUZE/BANDAGES/DRESSINGS) ×10 IMPLANT
GEL ULTRASOUND 20GR AQUASONIC (MISCELLANEOUS) ×5 IMPLANT
GLOVE NEODERM STRL 7.5 LF PF (GLOVE) ×12 IMPLANT
GLOVE SURG NEODERM 7.5  LF PF (GLOVE) ×3
GOWN STRL REUS W/ TWL LRG LVL3 (GOWN DISPOSABLE) ×16 IMPLANT
GOWN STRL REUS W/TWL LRG LVL3 (GOWN DISPOSABLE) ×4
HEMOSTAT POWDER SURGIFOAM 1G (HEMOSTASIS) ×10 IMPLANT
INSERT FOGARTY XLG (MISCELLANEOUS) ×5 IMPLANT
KIT BASIN OR (CUSTOM PROCEDURE TRAY) ×5 IMPLANT
KIT SUCTION CATH 14FR (SUCTIONS) ×5 IMPLANT
KIT TURNOVER KIT B (KITS) ×5 IMPLANT
KIT VASOVIEW HEMOPRO 2 VH 4000 (KITS) ×5 IMPLANT
MARKER GRAFT CORONARY BYPASS (MISCELLANEOUS) ×15 IMPLANT
NEEDLE 18GX1X1/2 (RX/OR ONLY) (NEEDLE) ×5 IMPLANT
NS IRRIG 1000ML POUR BTL (IV SOLUTION) ×25 IMPLANT
PACK E OPEN HEART (SUTURE) ×5 IMPLANT
PACK OPEN HEART (CUSTOM PROCEDURE TRAY) ×5 IMPLANT
PACK SPY-PHI (KITS) ×5 IMPLANT
PAD ARMBOARD 7.5X6 YLW CONV (MISCELLANEOUS) ×10 IMPLANT
PAD ELECT DEFIB RADIOL ZOLL (MISCELLANEOUS) ×5 IMPLANT
PENCIL BUTTON HOLSTER BLD 10FT (ELECTRODE) ×5 IMPLANT
PLATE STERNAL 2.3X208 14H 2-PK (Plate) ×5 IMPLANT
POSITIONER HEAD DONUT 9IN (MISCELLANEOUS) ×5 IMPLANT
POWDER SURGICEL 3.0 GRAM (HEMOSTASIS) ×5 IMPLANT
PUNCH AORTIC ROTATE 4.5MM 8IN (MISCELLANEOUS) ×5 IMPLANT
SCREW LOCKING TI 2.3X11MM (Screw) ×30 IMPLANT
SCREW LOCKING TI 2.3X13MM (Screw) ×15 IMPLANT
SCREW STERNAL LOCK 2.3MM (Screw) ×10 IMPLANT
SEALANT SURG COSEAL 8ML (VASCULAR PRODUCTS) ×5 IMPLANT
SET CARDIOPLEGIA MPS 5001102 (MISCELLANEOUS) ×5 IMPLANT
SHEARS HARMONIC 9CM CVD (BLADE) ×5 IMPLANT
STAPLER VISISTAT 35W (STAPLE) IMPLANT
SUPPORT HEART JANKE-BARRON (MISCELLANEOUS) ×5 IMPLANT
SUT BONE WAX W31G (SUTURE) ×5 IMPLANT
SUT MNCRL AB 3-0 PS2 18 (SUTURE) ×10 IMPLANT
SUT MNCRL AB 4-0 PS2 18 (SUTURE) ×5 IMPLANT
SUT PDS AB 1 CTX 36 (SUTURE) ×10 IMPLANT
SUT PROLENE 3 0 SH DA (SUTURE) ×10 IMPLANT
SUT PROLENE 4 0 RB 1 (SUTURE) ×1
SUT PROLENE 4 0 SH DA (SUTURE) ×10 IMPLANT
SUT PROLENE 4-0 RB1 .5 CRCL 36 (SUTURE) ×4 IMPLANT
SUT PROLENE 5 0 C 1 36 (SUTURE) ×20 IMPLANT
SUT PROLENE 6 0 C 1 30 (SUTURE) ×15 IMPLANT
SUT PROLENE 7 0 BV 1 (SUTURE) ×10 IMPLANT
SUT PROLENE 8 0 BV175 6 (SUTURE) ×5 IMPLANT
SUT PROLENE BLUE 7 0 (SUTURE) ×5 IMPLANT
SUT SILK  1 MH (SUTURE) ×1
SUT SILK 1 MH (SUTURE) ×4 IMPLANT
SUT SILK 2 0 SH CR/8 (SUTURE) IMPLANT
SUT SILK 2 0 TIES 10X30 (SUTURE) ×5 IMPLANT
SUT SILK 3 0 SH CR/8 (SUTURE) IMPLANT
SUT SILK 4 0 TIE 10X30 (SUTURE) ×5 IMPLANT
SUT STEEL 6MS V (SUTURE) ×5 IMPLANT
SUT STEEL SZ 6 DBL 3X14 BALL (SUTURE) ×5 IMPLANT
SUT VIC AB 1 CTX 36 (SUTURE) ×1
SUT VIC AB 1 CTX36XBRD ANBCTR (SUTURE) ×4 IMPLANT
SUT VIC AB 2-0 CT1 27 (SUTURE) ×2
SUT VIC AB 2-0 CT1 TAPERPNT 27 (SUTURE) ×8 IMPLANT
SUT VIC AB 2-0 CTX 27 (SUTURE) IMPLANT
SUT VIC AB 3-0 SH 27 (SUTURE)
SUT VIC AB 3-0 SH 27X BRD (SUTURE) IMPLANT
SUT VIC AB 3-0 X1 27 (SUTURE) IMPLANT
SYR 10ML LL (SYRINGE) ×5 IMPLANT
SYR 30ML LL (SYRINGE) ×5 IMPLANT
SYR 3ML LL SCALE MARK (SYRINGE) ×5 IMPLANT
SYR 50ML SLIP (SYRINGE) IMPLANT
SYSTEM SAHARA CHEST DRAIN ATS (WOUND CARE) ×5 IMPLANT
TAPE CLOTH SURG 4X10 WHT LF (GAUZE/BANDAGES/DRESSINGS) ×5 IMPLANT
TAPE PAPER 2X10 WHT MICROPORE (GAUZE/BANDAGES/DRESSINGS) ×5 IMPLANT
TOWEL GREEN STERILE (TOWEL DISPOSABLE) ×5 IMPLANT
TOWEL GREEN STERILE FF (TOWEL DISPOSABLE) ×5 IMPLANT
TRAY FOLEY SLVR 16FR TEMP STAT (SET/KITS/TRAYS/PACK) ×5 IMPLANT
TUBING LAP HI FLOW INSUFFLATIO (TUBING) ×5 IMPLANT
UNDERPAD 30X36 HEAVY ABSORB (UNDERPADS AND DIAPERS) ×5 IMPLANT
WATER STERILE IRR 1000ML POUR (IV SOLUTION) ×10 IMPLANT
WATER STERILE IRR 1000ML UROMA (IV SOLUTION) IMPLANT

## 2019-09-25 NOTE — Discharge Instructions (Signed)

## 2019-09-25 NOTE — Anesthesia Procedure Notes (Signed)
Procedure Name: Intubation Date/Time: 09/25/2019 7:51 AM Performed by: Colin Benton, CRNA Pre-anesthesia Checklist: Patient identified, Emergency Drugs available, Suction available and Patient being monitored Patient Re-evaluated:Patient Re-evaluated prior to induction Oxygen Delivery Method: Circle System Utilized Preoxygenation: Pre-oxygenation with 100% oxygen Induction Type: IV induction Ventilation: Mask ventilation without difficulty and Oral airway inserted - appropriate to patient size Laryngoscope Size: Mac and 4 Grade View: Grade I Tube type: Oral Number of attempts: 1 Airway Equipment and Method: Stylet and Oral airway Placement Confirmation: ETT inserted through vocal cords under direct vision,  positive ETCO2 and breath sounds checked- equal and bilateral Secured at: 22 cm Tube secured with: Tape Dental Injury: Teeth and Oropharynx as per pre-operative assessment

## 2019-09-25 NOTE — Transfer of Care (Signed)
Immediate Anesthesia Transfer of Care Note  Patient: Karen Galloway  Procedure(s) Performed: CORONARY ARTERY BYPASS GRAFTING (CABG), ON PUMP, TIMES FIVE, USING BILATERAL INTERNAL MAMMARY ARTERIES, ENDOSCOPICALLY HARVESTED RIGHT GREATER SAPHENOUS VEIN, AND LEFT RADIAL ARTERY HARVESTING (OPEN) (N/A Chest) RADIAL ARTERY HARVEST (Left Arm Lower) MAZE (N/A ) TRANSESOPHAGEAL ECHOCARDIOGRAM (TEE) (N/A ) INDOCYANINE GREEN FLUORESCENCE IMAGING (ICG) (N/A ) CLIPPING OF ATRIAL APPENDAGE (Left Chest)  Patient Location: ICU  Anesthesia Type:General  Level of Consciousness: sedated and Patient remains intubated per anesthesia plan  Airway & Oxygen Therapy: Patient remains intubated per anesthesia plan and Patient placed on Ventilator (see vital sign flow sheet for setting)  Post-op Assessment: Report given to RN and Post -op Vital signs reviewed and stable  Post vital signs: Reviewed and stable  Last Vitals:  Vitals Value Taken Time  BP    Temp    Pulse    Resp    SpO2      Last Pain:  Vitals:   09/25/19 0618  TempSrc:   PainSc: 0-No pain      Patients Stated Pain Goal: 3 (09/25/19 0618)  Complications: No complications documented.

## 2019-09-25 NOTE — Progress Notes (Signed)
VC= 1.1L NIF= -40

## 2019-09-25 NOTE — Anesthesia Procedure Notes (Signed)
Central Venous Catheter Insertion Performed by: Myrtie Soman, MD, anesthesiologist Start/End7/27/2021 7:05 AM, 09/25/2019 7:20 AM Patient location: Pre-op. Preanesthetic checklist: patient identified, IV checked, site marked, risks and benefits discussed, surgical consent, monitors and equipment checked, pre-op evaluation, timeout performed and anesthesia consent Position: Trendelenburg Lidocaine 1% used for infiltration Hand hygiene performed  and maximum sterile barriers used  Catheter size: 9 Fr PA cath was placed.MAC introducer Swan type:thermodilution PA Cath depth:45 Procedure performed using ultrasound guided technique. Ultrasound Notes:anatomy identified, needle tip was noted to be adjacent to the nerve/plexus identified, no ultrasound evidence of intravascular and/or intraneural injection and image(s) printed for medical record Attempts: 1 Following insertion, line sutured, dressing applied and Biopatch. Post procedure assessment: blood return through all ports, free fluid flow and no air  Patient tolerated the procedure well with no immediate complications.

## 2019-09-25 NOTE — Progress Notes (Addendum)
Patient endured wean from vent and was able to pass breathing mechanic parameters. However, ABG 7.30/44/22 BE -4. Dr. Laneta Simmers on evening rounds assessed and gave orders for 1 amp of HCO3, wait 1 hour and repeat ABG. She should be extubatable by then.  Rn will continue to monitor patient closely.

## 2019-09-25 NOTE — Anesthesia Procedure Notes (Signed)
Arterial Line Insertion Performed by: Lutricia Feil, RN  Patient location: Pre-op. Preanesthetic checklist: patient identified, IV checked, site marked, risks and benefits discussed, surgical consent, monitors and equipment checked, pre-op evaluation, timeout performed and anesthesia consent Lidocaine 1% used for infiltration and patient sedated Right, radial was placed Catheter size: 20 G Hand hygiene performed , maximum sterile barriers used  and Seldinger technique used Allen's test indicative of satisfactory collateral circulation Attempts: 1 Procedure performed without using ultrasound guided technique. Following insertion, dressing applied and Biopatch. Post procedure assessment: normal and unchanged  Patient tolerated the procedure well with no immediate complications.

## 2019-09-25 NOTE — Hospital Course (Signed)
Patient taken to OR on 09/25/2019 to undergo a CABG x 5, MAZE, left atrial clip.  Hospital Course:

## 2019-09-25 NOTE — H&P (Signed)
[address]       Karen Galloway [id number]             [phone]   History and Physical Interval Note:  09/25/2019 7:06 AM  Karen Galloway  has presented today for surgery, with the diagnosis of CAD.  The various methods of treatment have been discussed with the patient and family. After consideration of risks, benefits and other options for treatment, the patient has consented to  Procedure(s) with comments: CORONARY ARTERY BYPASS GRAFTING (CABG) (N/A) - BILATERAL IMA RADIAL ARTERY HARVEST (Left) possible, MAZE (N/A) TRANSESOPHAGEAL ECHOCARDIOGRAM (TEE) (N/A) INDOCYANINE GREEN FLUORESCENCE IMAGING (ICG) (N/A) as a surgical intervention.  The patient's history has been reviewed, patient examined, no change in status, stable for surgery.  I have reviewed the patient's chart and labs.  Questions were answered to the patient's satisfaction.     Karen Galloway    CARDIOTHORACIC SURGERY CONSULTATION REPORT  Referring Provider is No ref. provider found Primary Cardiologist is No primary care provider on file. PCP is Karen Paradise, MD  No chief complaint on file.   HPI:  59 year old lady is referred for consideration of CABG.  She was in her usual state of health until approximately 1 month ago when she began to noticed palpitations and chest pain with exertion.  She reported the symptoms to her local physicians in the [place] area.  Due to the uncertainty of her symptoms she underwent CT FFR at [facility].  This suggested severe coronary artery stenoses.  She underwent left heart catheterization in [place] which demonstrated severe multivessel coronary artery disease.  She has preserved left ventricular function.  She is now referred for consideration of CABG.  Since her work-up her pain has been relatively stable.  She denies any history of stroke, PND,  or orthopnea.  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  Past Medical History:    Diagnosis Date  . Acid reflux   . Coronary artery disease   . Hypertension   . Pre-diabetes    per patient    Past Surgical History:  Procedure Laterality Date  . CHOLECYSTECTOMY    . LEFT HEART CATH AND CORONARY ANGIOGRAPHY Left 09/12/2019   Procedure: LEFT HEART CATH AND CORONARY ANGIOGRAPHY;  Surgeon: Marcina Millard, MD;  Location: [facility] INVASIVE CV LAB;  Service: Cardiovascular;  Laterality: Left;    Family History  Problem Relation Age of Onset  . Breast cancer Paternal Aunt     Social History   Socioeconomic History  . Marital status: Divorced    Spouse name: Not on file  . Number of children: Not on file  . Years of education: Not on file  . Highest education level: Not on file  Occupational History  . Not on file  Tobacco Use  . Smoking status: Never Smoker  . Smokeless tobacco: Never Used  Vaping Use  . Vaping Use: Never used  Substance and Sexual Activity  . Alcohol use: Never  . Drug use: Never  . Sexual activity: Not on file  Other Topics Concern  . Not on file  Social History Narrative  . Not on file   Social Determinants of Health   Financial Resource Strain:   . Difficulty of Paying Living Expenses:   Food Insecurity:   . Worried About Programme researcher, broadcasting/film/video in the Last Year:   . Barista in the Last Year:   [employer]  Needs:   . Lack of Transportation (Medical):   Marland Kitchen Lack of Transportation (Non-Medical):   Physical Activity:   . Days of Exercise per Week:   . Minutes of Exercise per Session:   Stress:   . Feeling of Stress :   Social Connections:   . Frequency of Communication with Friends and Family:   . Frequency of Social Gatherings with Friends and Family:   . Attends Religious Services:   . Active Member of Clubs or Organizations:   . Attends Banker Meetings:   Marland Kitchen Marital Status:   Intimate Partner Violence:   . Fear of Current or Ex-Partner:   . Emotionally Abused:   Marland Kitchen Physically Abused:   . Sexually  Abused:     Current Facility-Administered Medications  Medication Dose Route Frequency Provider Last Rate Last Admin  . cefUROXime (ZINACEF) 1.5 g in sodium chloride 0.9 % 100 mL IVPB  1.5 g Intravenous To OR Geraldin Habermehl Z, MD      . cefUROXime (ZINACEF) 750 mg in sodium chloride 0.9 % 100 mL IVPB  750 mg Intravenous To OR Lasaro Primm, Merri Brunette, MD      . chlorhexidine (HIBICLENS) 4 % liquid 2 application  30 mL Topical UD Karen Dolin, MD      . Melene Muller ON 09/26/2019] chlorhexidine (PERIDEX) 0.12 % solution 15 mL  15 mL Mouth/Throat Once Julieanne Hadsall, Merri Brunette, MD      . dexmedetomidine (PRECEDEX) 400 MCG/100ML (4 mcg/mL) infusion  0.1-0.7 mcg/kg/hr Intravenous To OR Jayle Solarz Z, MD      . EPINEPHrine (ADRENALIN) 4 mg in NS 250 mL (0.016 mg/mL) premix infusion  0-10 mcg/min Intravenous To OR Ladoris Lythgoe Z, MD      . heparin 30,000 units/NS 1000 mL solution for CELLSAVER   Other To OR Alenna Russell Z, MD      . heparin sodium (porcine) 2,500 Units, papaverine 30 mg in electrolyte-148 (PLASMALYTE-148) 500 mL irrigation   Irrigation To OR Jaquanna Ballentine Z, MD      . insulin regular, human (MYXREDLIN) 100 units/ 100 mL infusion   Intravenous To OR Vickey Sages, Merri Brunette, MD      . lactated ringers infusion   Intravenous Continuous Eilene Ghazi, MD   New Bag at 09/25/19 [phone]  . magnesium sulfate (IV Push/IM) injection 40 mEq  40 mEq Other To OR Coreena Rubalcava, Merri Brunette, MD      . metoprolol tartrate (LOPRESSOR) tablet 12.5 mg  12.5 mg Oral Once Lynniah Janoski, Merri Brunette, MD      . milrinone (PRIMACOR) 20 MG/100 ML (0.2 mg/mL) infusion  0.3 mcg/kg/min Intravenous To OR Bently Wyss Z, MD      . nitroGLYCERIN 50 mg in dextrose 5 % 250 mL (0.2 mg/mL) infusion  2-200 mcg/min Intravenous To OR Morning Halberg, Merri Brunette, MD      . norepinephrine (LEVOPHED) 4mg  in premix infusion  0-40 mcg/min Intravenous To OR Micai Apolinar Z, MD      . phenylephrine (NEOSYNEPHRINE) 20-0.9 MG/250ML-% infusion  30-200 mcg/min  Intravenous To OR Ameliya Nicotra Z, MD      . potassium chloride injection 80 mEq  80 mEq Other To OR Beyza Bellino, , MD      . tranexamic acid (CYKLOKAPRON) 2,500 mg in sodium chloride 0.9 % 250 mL (10 mg/mL) infusion  1.5 mg/kg/hr Intravenous To OR Mickey Hebel Z, MD      . tranexamic acid (CYKLOKAPRON) bolus via infusion - over 30 minutes 1,591.5 mg  15 mg/kg Intravenous To OR Brendyn Mclaren Z, MD      . tranexamic acid (CYKLOKAPRON) pump prime solution 212 mg  2 mg/kg Intracatheter To OR Laporche Martelle Z, MD      . vancomycin (VANCOREADY) IVPB 1500 mg/300 mL  1,500 mg Intravenous To OR Kathrina Crosley, Merri Brunette, MD       Facility-Administered Medications Ordered in Other Encounters  Medication Dose Route Frequency Provider Last Rate Last Admin  . fentaNYL citrate (PF) (SUBLIMAZE) injection   Intravenous Anesthesia Intra-op Annabelle Harman A, CRNA   50 mcg at 09/25/19 2353  . midazolam (VERSED) 5 MG/5ML injection   Intravenous Anesthesia Intra-op Annabelle Harman A, CRNA   2 mg at 09/25/19 0703    Allergies  Allergen Reactions  . Losartan Rash      Review of Systems:   General:  Good appetite, stable energy, no change weight    Cardiac:  As per HPI  Respiratory:  No shortness of breath, no cough,   GI:   History of colonoscopy 5 years ago; history of colonic polyps and diverticulosis  GU:   Denies dysuria or kidney disease  Vascular:  Negative  Neuro:   Negative  Musculoskeletal: Negative  Skin:   Negative  Psych:   Negative  Eyes:   Negative  ENT:   Negative  Hematologic:  History of anemia  Endocrine:  No diabetes, does not check CBG's at home     Physical Exam:   BP (!) 152/66   Pulse 60   Temp 97.9 F (36.6 C) (Temporal)   Resp 18   Ht 5\' 5"  (1.651 m)   Wt (!) 106 kg   SpO2 100%   BMI 38.89 kg/m   General:   well-appearing  HEENT:  Unremarkable   Neck:   no JVD, no bruits, no adenopathy   Chest:   clear to auscultation, symmetrical breath sounds, no wheezes,  no rhonchi   CV:   RRR, no murmur   Abdomen:  soft, non-tender, no masses   Extremities:  warm, well-perfused, pulses intact, no LE edema  Rectal/GU  Deferred  Neuro:   Grossly non-focal and symmetrical throughout  Skin:   Clean and dry, no rashes, no breakdown   Diagnostic Tests:  I have personally reviewed her available imaging studies including left heart catheterization performed on 09/12/2019 and agree with the interpretation.  She has severe multivessel coronary artery disease including subtotal occlusion of the circumflex and right coronary systems.   Impression:  59 year old lady with severe multivessel coronary artery disease and preserved ejection fraction.  Agree with suggestion for CABG as best therapy with expectation for best prolonged survival.  Plan:  Schedule coronary bypass grafting on 09/25/2019.  She will undergo completion of her routine work-up in the interim.  This would include pre-CABG Doppler assessment of extracranial carotid arteries and bilateral radial arteries.    I spent in excess of 30 minutes during the conduct of this office consultation and >50% of this time involved direct face-to-face encounter with the patient for counseling and/or coordination of their care.          Level 3 Office Consult = 40 minutes         Level 4 Office Consult = 60 minutes         Level 5 Office Consult = 80 minutes  B.  09/27/2019, MD 09/25/2019 7:06 AM

## 2019-09-25 NOTE — Op Note (Signed)
CARDIOTHORACIC SURGERY OPERATIVE NOTE  Date of Procedure: 09/25/2019  Preoperative Diagnosis: Severe 3-vessel Coronary Artery Disease; paroxysmal atrial fibrillation  Postoperative Diagnosis: Same  Procedure:    Bilateral pulmonary vein isolation and left atrial appendage clipping  Coronary Artery Bypass Grafting x 5  Left Internal Mammary Artery to Distal Left Anterior Descending Coronary Artery; Saphenous Vein Graft to Posterior Descending Coronary Artery; left radial artery Graft to  Obtuse Marginal Branch of Left Circumflex Coronary Artery; Sapheonous Vein Graft to 1st Diagonal Branch Coronary Artery; pedicled RIMA to right PLA; Endoscopic Vein Harvest from right Thigh  Open left radial artery harvesting Bilateral IMA harvesting Completion graft surveillance with indocyanine green fluorescence imaging Rigid sternal reconstruction with linear plating system Application of prevena incisional management system Multilevel rib block with Exparel solution  Surgeon: B. Lorayne Marek, MD  Assistant: Jacques Earthly, PA-C  Anesthesia: get  Operative Findings:  preserved left ventricular systolic function  good quality internal mammary artery conduits  Good quality left radial artery graft  good quality saphenous vein conduit  good quality target vessels for grafting    BRIEF CLINICAL NOTE AND INDICATIONS FOR SURGERY  59 yo lady developed exertional angina recently. This was assessed by CT FFR which was +. She underwent LHC demonstrating severe 3V CAD. Her history is also notable for paroxysmal atrial fibrillation   DETAILS OF THE OPERATIVE PROCEDURE  Preparation:  The patient is brought to the operating room on the above mentioned date and central monitoring was established by the anesthesia team including placement of Swan-Ganz catheter and radial arterial line. The patient is placed in the supine position on the operating table.  Intravenous antibiotics are administered.  General endotracheal anesthesia is induced uneventfully. A Foley catheter is placed.  Baseline transesophageal echocardiogram was performed.  Findings were notable for no significant valve disease  The patient's chest, abdomen, left upper extremity, both groins, and both lower extremities are prepared and draped in a sterile manner. A time out procedure is performed.   Surgical Approach and Conduit Harvest:  Attention is 1st turned to the left upper extremity where the left radial artery is harvested by open technique using harmonic scalpel dissection. Once the artery is removed, the wound is closed in layers and the arm is tucked at the side. Next, a median sternotomy incision was performed and the left internal mammary artery is dissected from the chest wall and prepared for bypass grafting. The left internal mammary artery is notably good quality conduit. Simultaneously, the greater saphenous vein is obtained from the patient's right thigh using endoscopic vein harvest technique. The saphenous vein is notably good quality conduit. After removal of the saphenous vein, the small surgical incisions in the lower extremity are closed with absorbable suture. Attention is turned to the right chest wall where the RIMA is mobilized standardly.Full dose heparin is given intravenously. Following systemic heparinization, both internal mammary arteries were transected distally noted to have excellent flow. The grafts were treated with papaverine. Multilevel rib block was performed by injecting exparel solution into the visible rib spaces through the sternotomy.    Extracorporeal Cardiopulmonary Bypass and Myocardial Protection:  The pericardium is opened. The ascending aorta is normal in appearance. The ascending aorta and the right atrium are cannulated for cardiopulmonary bypass.  Adequate heparinization is verified.     The entire pre-bypass portion of the operation was notable for stable  hemodynamics.  Cardiopulmonary bypass was begun and the surface of the heart is inspected. Distal target vessels are selected for  coronary artery bypass grafting. A cardioplegia cannula is placed in the ascending aorta.  The right sided pulmonary veins are encircled and bipolar RFA treatment of the veins is performed.  The patient is allowed to cool passively to 34C systemic temperature.  The aortic cross clamp is applied and cold blood cardioplegia is delivered initially in an antegrade fashion through the aortic root.    Iced saline slush is applied for topical hypothermia.  The initial cardioplegic arrest is rapid with early diastolic arrest.  Repeat doses of cardioplegia are administered intermittently throughout the entire cross clamp portion of the operation through the aortic root and through subsequently placed vein grafts in order to maintain completely flat electrocardiogram.  Next, Marshall's Ligament is divided with electrocautery. The left sided pulmonary veins are encircled and treated with bipolar RFA.Marland Kitchen An atrial appendage clip is then applied.   Coronary Artery Bypass Grafting:   The  posterior descending branch of the right coronary artery was grafted using a reversed saphenous vein graft in an end-to-side fashion.  At the site of distal anastomosis the target vessel was good quality and measured approximately 2 mm in diameter.  The  Distal obtuse marginal branch of the left circumflex coronary artery was grafted using the left radial artery graft in an end-to-side fashion.  At the site of distal anastomosis the target vessel was good quality and measured approximately 1.5 mm in diameter.  The 1st diagonal branch of the left anterior descending coronary artery was grafted using a reversed saphenous vein graft in an end-to-side fashion.  At the site of distal anastomosis the target vessel was good quality and measured approximately 2 mm in diameter.  The posterolateral branch of the  right coronary artery was grafted in and end-to-side fashion using the pedicled RIMA graft.  At the site of distal anastomosis the target vessel was good quality and measured approximately 1.5 mm in diameter. Anastomotic patency and runoff was confirmed with indocyanine green fluorescence imaging (SPY).  The distal left anterior coronary artery was grafted with the left internal mammary artery in an end-to-side fashion.  At the site of distal anastomosis the target vessel was good quality and measured approximately 1.5  mm in diameter. Anastomotic patency and runoff was confirmed with indocyanine green fluorescence imaging (SPY).  The proximal vein  graft anastomoses were placed directly to the ascending aorta prior to removal of the aortic cross clamp. The proximal radial artery graft was anastomosed to the hood of the diagonal vein graft.  Deairing procedures were performed and the aortic cross clamp was removed.   Procedure Completion:  All proximal and distal coronary anastomoses were inspected for hemostasis and appropriate graft orientation. Epicardial pacing wires are fixed to the right ventricular outflow tract and to the right atrial appendage. The patient is rewarmed to 37C temperature. The patient is weaned and disconnected from cardiopulmonary bypass.  The patient's rhythm at separation from bypass was NSR.  The patient was weaned from cardiopulmonary bypass  without any inotropic support.   Followup transesophageal echocardiogram performed after separation from bypass revealed no changes from the preoperative exam.  The aortic and venous cannula were removed uneventfully. Protamine was administered to reverse the anticoagulation. The mediastinum and pleural space were inspected for hemostasis and irrigated with saline solution. The mediastinum and both pleural spaces were drained using fluted chest tubes placed through separate stab incisions inferiorly.  The soft tissues anterior to the  aorta were reapproximated loosely. The sternum is closed with double strength sternal  wire. The soft tissues anterior to the sternum were closed in multiple layers and the skin is closed with a running subcuticular skin closure.  The post-bypass portion of the operation was notable for stable rhythm and hemodynamics.  No blood products were administered during the operation.   Disposition:  The patient tolerated the procedure well and is transported to the surgical intensive care in stable condition. There are no intraoperative complications. All sponge instrument and needle counts are verified correct at completion of the operation.    Brantley Fling, MD 09/25/2019 1:36 PM

## 2019-09-25 NOTE — Progress Notes (Signed)
Patient ID: Karen Galloway, female   DOB: [birth year], 59 y.o.   MRN: [id number]  TCTS Evening Rounds:   Hemodynamically stable  CI = 2.4  Has started to wake up on vent. Still sleepy. PCO2 44. BE -4. Weaning parameters ok.  Urine output good  CT output low  CBC    Component Value Date/Time   WBC 15.5 (H) 09/25/2019 1411   RBC 3.53 (L) 09/25/2019 1411   HGB 10.6 (L) 09/25/2019 1411   HCT 32.1 (L) 09/25/2019 1411   PLT 182 09/25/2019 1411   MCV 90.9 09/25/2019 1411   MCH 30.0 09/25/2019 1411   MCHC 33.0 09/25/2019 1411   RDW 12.7 09/25/2019 1411     BMET    Component Value Date/Time   NA 138 09/21/2019 1016   K 4.3 09/21/2019 1016   CL 103 09/21/2019 1016   CO2 26 09/21/2019 1016   GLUCOSE 102 (H) 09/21/2019 1016   BUN 22 (H) 09/21/2019 1016   CREATININE 0.69 09/21/2019 1016   CALCIUM 9.6 09/21/2019 1016   GFRNONAA >60 09/21/2019 1016   GFRAA >60 09/21/2019 1016     A/P:  Stable postop course. Continue current plans. Will give her amp of sodium bicarb, wait another hr and repeat ABG. Should be awake enough for extubation then.

## 2019-09-25 NOTE — Brief Op Note (Addendum)
09/25/2019  12:21 PM  PATIENT:  Karen Galloway  60 y.o. female  PRE-OPERATIVE DIAGNOSIS:  1. Coronary Artery Disease 2. Atrial fibrillation  POST-OPERATIVE DIAGNOSIS:  1. Coronary Artery Disease 2. Atrial fibrillation  PROCEDURE:  TRANSESOPHAGEAL ECHOCARDIOGRAM (TEE), CORONARY ARTERY BYPASS GRAFTING (CABG)x 5 (LIMA to LAD, SVG to DIAGONAL, LEFT RADIAL ARTERY to OM, RIMA to PBL, SVG to PDA) with BILATERAL IMA HARVEST, LEFT RADIAL ARTERY HARVEST, LEFT SIDED [facility]O and BIPOLAR MAZE,  CLIPPING OF LEFT ATRIAL APPENDAGE,  INDOCYANINE GREEN FLUORESCENCE IMAGING (ICG)   SVG HARVEST and PREP TIME: 24 minutes and 14 minutes RADIAL ARTERY HARVEST and PREP TIME: 20 minutes and 7 minutes  SURGEON:  Surgeon(s) and Role:    [name], MD - Primary  PHYSICIAN ASSISTANT: Doree Fudge PA-C  ASSISTANTS: Teena Dunk RNFA  ANESTHESIA:   general  EBL:  Per anesthesia and perfusion record  DRAINS: Chest tubes placed in the mediastinal and pleural spaces   COUNTS CORRECT:  YES  DICTATION: .Dragon Dictation  PLAN OF CARE: Admit to inpatient   PATIENT DISPOSITION:  ICU - intubated and hemodynamically stable.   Delay start of Pharmacological VTE agent (>24hrs) due to surgical blood loss or risk of bleeding: yes  BASELINE WEIGHT: 106 kg  Agree with notation. Dereona Kolodny Z. Vickey Sages, MD [phone]

## 2019-09-25 NOTE — Anesthesia Procedure Notes (Signed)
Anesthesia Procedure Image    

## 2019-09-25 NOTE — Anesthesia Preprocedure Evaluation (Signed)
Anesthesia Evaluation  Patient identified by MRN, date of birth, ID band Patient awake    Reviewed: Allergy & Precautions, NPO status , Patient's Chart, lab work & pertinent test results  Airway Mallampati: II  TM Distance: >3 FB Neck ROM: Full    Dental no notable dental hx.    Pulmonary neg pulmonary ROS,    Pulmonary exam normal breath sounds clear to auscultation       Cardiovascular hypertension, Pt. on medications and Pt. on home beta blockers + CAD  Normal cardiovascular exam Rhythm:Regular Rate:Normal  1. Left ventricular ejection fraction, by estimation, is 60 to 65%. The  left ventricle has normal function. The left ventricle has no regional  wall motion abnormalities. There is mild left ventricular hypertrophy.  Left ventricular diastolic parameters  are consistent with Grade I diastolic dysfunction (impaired relaxation).  Elevated left ventricular end-diastolic pressure.  2. Right ventricular systolic function is low normal. The right  ventricular size is normal. There is normal pulmonary artery systolic  pressure.  3. The mitral valve is abnormal. Mild mitral valve regurgitation.  4. The aortic valve is tricuspid. Aortic valve regurgitation is not  visualized.  5. The inferior vena cava is normal in size with greater than 50%  respiratory variability, suggesting right atrial pressure of 3 mmHg.   FINDINGS  Left Ventricle: Left ventricular ejection fraction, by estimation, is 60  to 65%. The left ventricle has normal function. The left ventricle has no  regional wall motion abnormalities. The left ventricular internal cavity  size was normal in size. There is  mild left ventricular hypertrophy. Left ventricular diastolic parameters  are consistent with Grade I diastolic dysfunction (impaired relaxation).  Elevated left ventricular end-diastolic pressure.   Right Ventricle: The right ventricular size is  normal. No increase in  right ventricular wall thickness. Right ventricular systolic function is  low normal. There is normal pulmonary artery systolic pressure. The  tricuspid regurgitant velocity is 2.28 m/s,  and with an assumed right atrial pressure of 3 mmHg, the estimated right  ventricular systolic pressure is 23.8 mmHg.    Neuro/Psych negative neurological ROS  negative psych ROS   GI/Hepatic negative GI ROS, Neg liver ROS,   Endo/Other  negative endocrine ROS  Renal/GU negative Renal ROS  negative genitourinary   Musculoskeletal negative musculoskeletal ROS (+)   Abdominal   Peds negative pediatric ROS (+)  Hematology negative hematology ROS (+)   Anesthesia Other Findings   Reproductive/Obstetrics negative OB ROS                             Anesthesia Physical Anesthesia Plan  ASA: III  Anesthesia Plan: General   Post-op Pain Management:    Induction: Intravenous  PONV Risk Score and Plan: 0  Airway Management Planned: Oral ETT  Additional Equipment: Arterial line, CVP, PA Cath, TEE and Ultrasound Guidance Line Placement  Intra-op Plan:   Post-operative Plan: Post-operative intubation/ventilation  Informed Consent: I have reviewed the patients History and Physical, chart, labs and discussed the procedure including the risks, benefits and alternatives for the proposed anesthesia with the patient or authorized representative who has indicated his/her understanding and acceptance.     Dental advisory given  Plan Discussed with: CRNA and Surgeon  Anesthesia Plan Comments:         Anesthesia Quick Evaluation

## 2019-09-26 ENCOUNTER — Encounter (HOSPITAL_COMMUNITY): Payer: Self-pay | Admitting: Cardiothoracic Surgery

## 2019-09-26 ENCOUNTER — Inpatient Hospital Stay (HOSPITAL_COMMUNITY): Payer: 59

## 2019-09-26 LAB — BASIC METABOLIC PANEL
Anion gap: 7 (ref 5–15)
Anion gap: 6 (ref 5–15)
BUN: 11 mg/dL (ref 6–20)
BUN: 10 mg/dL (ref 6–20)
CO2: 23 mmol/L (ref 22–32)
CO2: 24 mmol/L (ref 22–32)
Calcium: 7.2 mg/dL — ABNORMAL LOW (ref 8.9–10.3)
Calcium: 7.9 mg/dL — ABNORMAL LOW (ref 8.9–10.3)
Chloride: 104 mmol/L (ref 98–111)
Chloride: 103 mmol/L (ref 98–111)
Creatinine, Ser: 0.53 mg/dL (ref 0.44–1.00)
Creatinine, Ser: 0.55 mg/dL (ref 0.44–1.00)
GFR calc Af Amer: >60 mL/min (ref >60)
GFR calc Af Amer: >60 mL/min (ref >60)
GFR calc non Af Amer: >60 mL/min (ref >60)
GFR calc non Af Amer: >60 mL/min (ref >60)
Glucose, Bld: 111 mg/dL — ABNORMAL HIGH (ref 70–99)
Glucose, Bld: 110 mg/dL — ABNORMAL HIGH (ref 70–99)
Potassium: 4.2 mmol/L (ref 3.5–5.1)
Potassium: 4.7 mmol/L (ref 3.5–5.1)
Sodium: 134 mmol/L — ABNORMAL LOW (ref 135–145)
Sodium: 133 mmol/L — ABNORMAL LOW (ref 135–145)

## 2019-09-26 LAB — GLUCOSE, CAPILLARY
Glucose-Capillary: 104 mg/dL — ABNORMAL HIGH (ref 70–99)
Glucose-Capillary: 106 mg/dL — ABNORMAL HIGH (ref 70–99)
Glucose-Capillary: 107 mg/dL — ABNORMAL HIGH (ref 70–99)
Glucose-Capillary: 110 mg/dL — ABNORMAL HIGH (ref 70–99)
Glucose-Capillary: 111 mg/dL — ABNORMAL HIGH (ref 70–99)
Glucose-Capillary: 113 mg/dL — ABNORMAL HIGH (ref 70–99)
Glucose-Capillary: 123 mg/dL — ABNORMAL HIGH (ref 70–99)
Glucose-Capillary: 130 mg/dL — ABNORMAL HIGH (ref 70–99)
Glucose-Capillary: 94 mg/dL (ref 70–99)
Glucose-Capillary: 98 mg/dL (ref 70–99)

## 2019-09-26 LAB — CBC
HCT: 29.7 % — ABNORMAL LOW (ref 36.0–46.0)
HCT: 30.8 % — ABNORMAL LOW (ref 36.0–46.0)
Hemoglobin: 9.9 g/dL — ABNORMAL LOW (ref 12.0–15.0)
Hemoglobin: 9.8 g/dL — ABNORMAL LOW (ref 12.0–15.0)
MCH: 30.7 pg (ref 26.0–34.0)
MCH: 29.8 pg (ref 26.0–34.0)
MCHC: 33.3 g/dL (ref 30.0–36.0)
MCHC: 31.8 g/dL (ref 30.0–36.0)
MCV: 92 fL (ref 80.0–100.0)
MCV: 93.6 fL (ref 80.0–100.0)
Platelets: 175 10*3/uL (ref 150–400)
Platelets: 154 10*3/uL (ref 150–400)
RBC: 3.23 MIL/uL — ABNORMAL LOW (ref 3.87–5.11)
RBC: 3.29 MIL/uL — ABNORMAL LOW (ref 3.87–5.11)
RDW: 12.9 % (ref 11.5–15.5)
RDW: 13.1 % (ref 11.5–15.5)
WBC: 10.1 10*3/uL (ref 4.0–10.5)
WBC: 11 10*3/uL — ABNORMAL HIGH (ref 4.0–10.5)
nRBC: 0 % (ref 0.0–0.2)
nRBC: 0 % (ref 0.0–0.2)

## 2019-09-26 LAB — ECHO INTRAOPERATIVE TEE
AV Mean grad: 2 mmHg
AV Peak grad: 0 mmHg
Ao pk vel: 0.1 m/s
Height: 65 in
Weight: 3739 oz

## 2019-09-26 LAB — MAGNESIUM
Magnesium: 2.4 mg/dL (ref 1.7–2.4)
Magnesium: 2.5 mg/dL — ABNORMAL HIGH (ref 1.7–2.4)

## 2019-09-26 MED ORDER — ASPIRIN EC 81 MG PO TBEC
81.0000 mg | DELAYED_RELEASE_TABLET | Freq: Every day | ORAL | Status: DC
  Administered 2019-09-26 – 2019-09-30 (×5): 81 mg via ORAL
  Filled 2019-09-26 (×5): qty 1

## 2019-09-26 MED ORDER — ISOSORBIDE DINITRATE 10 MG PO TABS
10.0000 mg | ORAL_TABLET | Freq: Three times a day (TID) | ORAL | Status: DC
  Administered 2019-09-26 – 2019-09-30 (×13): 10 mg via ORAL
  Filled 2019-09-26 (×13): qty 1

## 2019-09-26 MED ORDER — INSULIN ASPART 100 UNIT/ML ~~LOC~~ SOLN
0.0000 [IU] | SUBCUTANEOUS | Status: DC
  Administered 2019-09-28: 8 [IU] via SUBCUTANEOUS
  Administered 2019-09-29: 2 [IU] via SUBCUTANEOUS

## 2019-09-26 MED ORDER — CLOPIDOGREL BISULFATE 75 MG PO TABS
75.0000 mg | ORAL_TABLET | Freq: Every day | ORAL | Status: DC
  Administered 2019-09-26 – 2019-09-30 (×5): 75 mg via ORAL
  Filled 2019-09-26 (×5): qty 1

## 2019-09-26 NOTE — Progress Notes (Signed)
EVENING ROUNDS NOTE :     [address]       [place] [address]             [phone]                 1 Day Post-Op Procedure(s) (LRB): CORONARY ARTERY BYPASS GRAFTING (CABG), ON PUMP, TIMES FIVE, USING BILATERAL INTERNAL MAMMARY ARTERIES, ENDOSCOPICALLY HARVESTED RIGHT GREATER SAPHENOUS VEIN, AND LEFT RADIAL ARTERY HARVESTING (OPEN) (N/A) RADIAL ARTERY HARVEST (Left) MAZE (N/A) TRANSESOPHAGEAL ECHOCARDIOGRAM (TEE) (N/A) INDOCYANINE GREEN FLUORESCENCE IMAGING (ICG) (N/A) CLIPPING OF ATRIAL APPENDAGE (Left)   Total Length of Stay:  LOS: 1 day  Events:   No events Resting comfortably Good uop 320 from chest tubes    BP (!) 86/65   Pulse 92   Temp 98.7 F (37.1 C) (Oral)   Resp (!) 26   Ht 5\' 5"  (1.651 m)   Wt (!) 109.1 kg   SpO2 100%   BMI 40.02 kg/m   PAP: (20-41)/(9-18) 23/12 CO:  [3.8 L/min-5.2 L/min] 4.7 L/min CI:  [1.8 L/min/m2-2.5 L/min/m2] 2.2 L/min/m2     . sodium chloride Stopped (09/26/19 0806)  . sodium chloride    . sodium chloride 20 mL/hr at 09/25/19 1400  . cefUROXime (ZINACEF)  IV Stopped (09/26/19 0457)  . lactated ringers    . lactated ringers Stopped (09/26/19 1349)  . lactated ringers    . nitroGLYCERIN Stopped (09/26/19 1134)    I/O last 3 completed shifts: In: 5621.6 [P.O.:480; I.V.:3106.2; Blood:507; IV Piggyback:1528.4] Out: 5975 [Urine:4470; Blood:845; Chest Tube:660]   CBC Latest Ref Rng & Units 09/26/2019 09/25/2019 09/25/2019  WBC 4.0 - 10.5 K/uL 10.1 9.2 -  Hemoglobin 12.0 - 15.0 g/dL 09/27/2019) 0.3(K) 7.4(Q)  Hematocrit 36 - 46 % 29.7(L) 30.0(L) 29.0(L)  Platelets 150 - 400 K/uL 175 133(L) -    BMP Latest Ref Rng & Units 09/26/2019 09/25/2019 09/25/2019  Glucose 70 - 99 mg/dL 09/27/2019) 638(V) -  BUN 6 - 20 mg/dL 11 9 -  Creatinine 564(P - 1.00 mg/dL 3.29 5.18 -  Sodium 8.41 - 145 mmol/L 134(L) 138 142  Potassium 3.5 - 5.1 mmol/L 4.2 3.7 3.8  Chloride 98 - 111 mmol/L 104 107 -  CO2 22 - 32 mmol/L 23 22 -  Calcium  8.9 - 10.3 mg/dL 7.2(L) 6.9(L) -    ABG    Component Value Date/Time   PHART 7.326 (L) 09/25/2019 1907   PCO2ART 44.4 09/25/2019 1907   PO2ART 130 (H) 09/25/2019 1907   HCO3 23.1 09/25/2019 1907   TCO2 24 09/25/2019 1907   ACIDBASEDEF 3.0 (H) 09/25/2019 1907   O2SAT 99.0 09/25/2019 1907       09/27/2019, MD 09/26/2019 4:30 PM

## 2019-09-26 NOTE — Anesthesia Postprocedure Evaluation (Signed)
Anesthesia Post Note  Patient: Karen Galloway  Procedure(s) Performed: CORONARY ARTERY BYPASS GRAFTING (CABG), ON PUMP, TIMES FIVE, USING BILATERAL INTERNAL MAMMARY ARTERIES, ENDOSCOPICALLY HARVESTED RIGHT GREATER SAPHENOUS VEIN, AND LEFT RADIAL ARTERY HARVESTING (OPEN) (N/A Chest) RADIAL ARTERY HARVEST (Left Arm Lower) MAZE (N/A ) TRANSESOPHAGEAL ECHOCARDIOGRAM (TEE) (N/A ) INDOCYANINE GREEN FLUORESCENCE IMAGING (ICG) (N/A ) CLIPPING OF ATRIAL APPENDAGE (Left Chest)     Patient location during evaluation: SICU Anesthesia Type: General Level of consciousness: sedated Pain management: pain level controlled Vital Signs Assessment: post-procedure vital signs reviewed and stable Respiratory status: patient remains intubated per anesthesia plan Cardiovascular status: stable Postop Assessment: no apparent nausea or vomiting Anesthetic complications: no   No complications documented.  Last Vitals:  Vitals:   09/26/19 1500 09/26/19 1541  BP: (!) 86/65   Pulse: 92   Resp: (!) 26   Temp:  37.1 C  SpO2: 100%     Last Pain:  Vitals:   09/26/19 1626  TempSrc:   PainSc: Asleep                 Tyeler Goedken S

## 2019-09-26 NOTE — Progress Notes (Addendum)
[facility] DAILY ICU PROGRESS NOTE                   [address]            [place] [address]          [phone]   1 Day Post-Op Procedure(s) (LRB): CORONARY ARTERY BYPASS GRAFTING (CABG), ON PUMP, TIMES FIVE, USING BILATERAL INTERNAL MAMMARY ARTERIES, ENDOSCOPICALLY HARVESTED RIGHT GREATER SAPHENOUS VEIN, AND LEFT RADIAL ARTERY HARVESTING (OPEN) (N/A) RADIAL ARTERY HARVEST (Left) MAZE (N/A) TRANSESOPHAGEAL ECHOCARDIOGRAM (TEE) (N/A) INDOCYANINE GREEN FLUORESCENCE IMAGING (ICG) (N/A) CLIPPING OF ATRIAL APPENDAGE (Left)  Total Length of Stay:  LOS: 1 day   Subjective: Minor nausea has passed, some pain  Objective: Vital signs in last 24 hours: Temp:  [97 F (36.1 C)-99.7 F (37.6 C)] 99.1 F (37.3 C) (07/28 0715) Pulse Rate:  [80-94] 92 (07/28 0715) Cardiac Rhythm: Atrial paced (07/28 0400) Resp:  [11-32] 24 (07/28 0715) BP: (76-123)/(51-90) 99/69 (07/28 0700) SpO2:  [98 %-100 %] 100 % (07/28 0715) Arterial Line BP: (92-141)/(50-87) 122/55 (07/28 0715) FiO2 (%):  [40 %-50 %] 40 % (07/27 1558) Weight:  [109.1 kg] 109.1 kg (07/28 0500)  Filed Weights   09/25/19 0608 09/26/19 0500  Weight: (!) 106 kg (!) 109.1 kg    Weight change: 3.1 kg   Hemodynamic parameters for last 24 hours: PAP: (20-41)/(9-19) 24/10 CO:  [3 L/min-5.2 L/min] 4.7 L/min CI:  [1.2 L/min/m2-2.5 L/min/m2] 2.2 L/min/m2  Intake/Output from previous day: 07/27 0701 - 07/28 0700 In: 5621.6 [P.O.:480; I.V.:3106.2; Blood:507; IV Piggyback:1528.4] Out: 5975 [Urine:4470; Blood:845; Chest Tube:660]  Intake/Output this shift: No intake/output data recorded.  Current Meds: Scheduled Meds: . acetaminophen  1,000 mg Oral Q6H   Or  . acetaminophen (TYLENOL) oral liquid 160 mg/5 mL  1,000 mg Per Tube Q6H  . aspirin EC  325 mg Oral Daily   Or  . aspirin  324 mg Per Tube Daily  . atorvastatin  10 mg Oral QHS  . bisacodyl  10 mg Oral Daily   Or  . bisacodyl  10 mg Rectal Daily  .  Chlorhexidine Gluconate Cloth  6 each Topical Daily  . Chlorhexidine Gluconate Cloth  6 each Topical Daily  . docusate sodium  200 mg Oral Daily  . insulin aspart  0-24 Units Subcutaneous Q4H  . mouth rinse  15 mL Mouth Rinse BID  . metoprolol tartrate  12.5 mg Oral BID   Or  . metoprolol tartrate  12.5 mg Per Tube BID  . multivitamin with minerals  1 tablet Oral Daily  . [START ON 09/27/2019] pantoprazole  40 mg Oral Daily  . potassium chloride  20 mEq Oral Q4H  . sodium chloride flush  10-40 mL Intracatheter Q12H  . sodium chloride flush  3 mL Intravenous Q12H   Continuous Infusions: . sodium chloride 20 mL/hr at 09/26/19 0600  . sodium chloride    . sodium chloride 20 mL/hr at 09/25/19 1400  . cefUROXime (ZINACEF)  IV Stopped (09/26/19 0457)  . dexmedetomidine (PRECEDEX) IV infusion Stopped (09/26/19 0503)  . lactated ringers    . lactated ringers 20 mL/hr at 09/26/19 0600  . lactated ringers    . nitroGLYCERIN 5 mcg/min (09/26/19 0600)  . phenylephrine (NEO-SYNEPHRINE) Adult infusion 20 mcg/min (09/26/19 0600)   PRN Meds:.sodium chloride, lactated ringers, metoprolol tartrate, midazolam, morphine injection, ondansetron (ZOFRAN) IV, oxyCODONE, sodium chloride flush, sodium chloride flush, traMADol  General appearance: alert, cooperative and no distress Heart: regular  rate and rhythm Lungs: clear to auscultation bilaterally Abdomen: benign Extremities: minor edema Wound: left and N/V intact, dressings in place except radial hares (harvest ) Lab Results: CBC: Recent Labs    09/25/19 2012 09/26/19 0301  WBC 9.2 10.1  HGB 9.8* 9.9*  HCT 30.0* 29.7*  PLT 133* 175   BMET:  Recent Labs    09/25/19 2012 09/26/19 0301  NA 138 134*  K 3.7 4.2  CL 107 104  CO2 22 23  GLUCOSE 148* 111*  BUN 9 11  CREATININE 0.55 0.53  CALCIUM 6.9* 7.2*    CMET: Lab Results  Component Value Date   WBC 10.1 09/26/2019   HGB 9.9 (L) 09/26/2019   HCT 29.7 (L) 09/26/2019   PLT 175  09/26/2019   GLUCOSE 111 (H) 09/26/2019   ALT 39 09/21/2019   AST 30 09/21/2019   NA 134 (L) 09/26/2019   K 4.2 09/26/2019   CL 104 09/26/2019   CREATININE 0.53 09/26/2019   BUN 11 09/26/2019   CO2 23 09/26/2019   INR 1.6 (H) 09/25/2019   HGBA1C 5.9 (H) 09/21/2019      PT/INR:  Recent Labs    09/25/19 1411  LABPROT 18.3*  INR 1.6*   Radiology: [facility] Chest Port 1 View  Result Date: 09/25/2019 CLINICAL DATA:  Postoperative EXAM: PORTABLE CHEST 1 VIEW COMPARISON:  September 21, 2019 FINDINGS: Status post median sternotomy, CABG, and atrial appendage clip placement. ETT tip terminates 3.7 cm above the carina. RIGHT neck PA catheter tip terminates over the RIGHT ventricular outflow tract. Enteric tube side port projects over the stomach. Bilateral chest tubes. No significant pneumothoraces. Mild perihilar vascular prominence. Scattered bibasilar atelectasis. Visualized abdomen is unremarkable. Degenerative changes of the thoracic spine. IMPRESSION: 1.  Support apparatus as described above. Electronically Signed   By: Meda Klinefelter MD   On: 09/25/2019 15:19     Assessment/Plan: S/P Procedure(s) (LRB): CORONARY ARTERY BYPASS GRAFTING (CABG), ON PUMP, TIMES FIVE, USING BILATERAL INTERNAL MAMMARY ARTERIES, ENDOSCOPICALLY HARVESTED RIGHT GREATER SAPHENOUS VEIN, AND LEFT RADIAL ARTERY HARVESTING (OPEN) (N/A) RADIAL ARTERY HARVEST (Left) MAZE (N/A) TRANSESOPHAGEAL ECHOCARDIOGRAM (TEE) (N/A) INDOCYANINE GREEN FLUORESCENCE IMAGING (ICG) (N/A) CLIPPING OF ATRIAL APPENDAGE (Left)   1 doing well POD # 1  2 stable VSS with some relative hypotension, transition to po nitrate, neo just stopped 3 CXR fairly clear throughout 4 expected ABL anemia- stable 5 CT- 660 cc , keep in place today 6 excellent UOP, will need some diuretics but cont spontaneous for now as UOP is good. Normal renal fxn 7 good BS control- usual managemrnt, not a diabetic 8 d/c Swan and aline 9 advance die 10 routine  rehab/pulm toilet  Rowe Clack PA-C Pager [phone] 09/26/2019 7:34 AM   Pt seen and examined; chart/results reviewed personally. Agree with documentation. Plan routine early recovery. Mobilization; pulm toilet Leave chest tubes Madalynn Pickelsimer Z. Vickey Sages, MD [phone]

## 2019-09-26 NOTE — Discharge Summary (Signed)
Physician Discharge Summary  Patient ID: Karen Galloway MRN: [id number] DOB/AGE: 57-Aug-1962 57 y.o.  Admit date: 09/25/2019 Discharge date: 09/30/2019  Admission Diagnoses:  Discharge Diagnoses:  Active Problems:   S/P CABG x 5   S/P Maze operation for atrial fibrillation   Coronary artery disease  Patient Active Problem List   Diagnosis Date Noted  . S/P CABG x 5 09/25/2019  . S/P Maze operation for atrial fibrillation 09/25/2019  . Coronary artery disease 09/25/2019  . Anemia, unspecified 09/18/2019  . Benign essential hypertension 09/18/2019  . Degenerative arthritis of lumbar spine 09/18/2019  . Hyperlipidemia, mixed 09/18/2019  . Obesity 09/18/2019  . Prediabetes 10/07/2016  . 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  She was in her usual state of health until approximately 1 month ago when she began to noticed palpitations and chest pain with exertion.  She reported the symptoms to her local physicians in the [place] area.  Due to the uncertainty of her symptoms she underwent CT FFR at [facility].  This suggested severe coronary artery stenoses.  She underwent left heart catheterization in  which demonstrated severe multivessel coronary artery disease.  She has preserved left ventricular function.  She is now referred for consideration of CABG.  Since her work-up her pain has been relatively stable.  She denies any history of stroke, PND,  or orthopnea.  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  The patient and her studies were evaluated by Dr. Vickey Sages who recommended CABG as her best surgical revascularization option and she was admitted for the procedure.  Hospital Course:   She was taken to the operating room on 09/25/2019.  She underwent CABG x 5 utilizing LIMA to LAD, SVG to PDA, Left radial artery to OM, SVG to Diagonal, and RIMA to PLA.  She also underwent  endoscopic harvest of greater saphenous vein from her right thigh, open left radial artery harvest, sternal reconstruction with rigid plating, and intercostal muscle block.  She tolerated the procedure without difficulty and was taken to the SICU in stable condition.  She was extubated the evening of surgery.  During her stay in the SICU the patient was weaned off Neo-synephrine as hemodynamics allowed.  She was transitioned to Isosordil for her radial artery graft.  Her chest tubes and arterial lines were removed without difficulty.  She was started on diuretics for mild volume overload state.  She is maintaining NSR and was stable for transfer to the progressive care unit on 09/28/2019.  The patient continued to make good progress.  She was transitioned to an oral course of Lasix.  She was hypokalemic and supplemented accordingly.  She remained in NSR and her pacing wires were removed without difficulty.  Her pravena wound vac was removed. Her sternotomy incision has some mild skin breakdown along the lower end.  There is no evidence of infection, but she will be treated with Keflex prophylactically.  Her other surgical incisions are healing without evidence on infection.  She is ambulating independently.  She is medically stable for discharge home today.   Consults: None  Significant Diagnostic Studies: angiography:    Prox Cx to Mid Cx lesion is 100% stenosed.  Ost LAD to Prox LAD lesion is 90% stenosed.  Mid LAD-1 lesion is 20% stenosed.  Mid LAD-2 lesion is 30% stenosed.  Prox RCA to Mid RCA lesion is 70% stenosed.  Dist RCA lesion  is 95% stenosed.  RPDA lesion is 99% stenosed.  RPAV lesion is 100% stenosed.  Prox RCA lesion is 40% stenosed.   1.  Severe three-vessel coronary artery disease with high-grade 90% stenosis ostial/proximal LAD, occluded proximal/mid left circumflex, extensive 95% stenosis distal RCA, 99% stenosis ostial PDA 2.  Preserved left ventricular  function  Treatments: Surgery: CARDIOTHORACIC SURGERY OPERATIVE NOTE  Date of Procedure:    09/25/2019  Preoperative Diagnosis:      Severe 3-vessel Coronary Artery Disease; paroxysmal atrial fibrillation  Postoperative Diagnosis:    Same  Procedure:        Bilateral pulmonary vein isolation and left atrial appendage clipping  Coronary Artery Bypass Grafting x 5             Left Internal Mammary Artery to Distal Left Anterior Descending Coronary Artery; Saphenous Vein Graft to Posterior Descending Coronary Artery; left radial artery Graft to  Obtuse Marginal Branch of Left Circumflex Coronary Artery; Sapheonous Vein Graft to 1st Diagonal Branch Coronary Artery; pedicled RIMA to right PLA; Endoscopic Vein Harvest from right Thigh  Open left radial artery harvesting Bilateral IMA harvesting Completion graft surveillance with indocyanine green fluorescence imaging Rigid sternal reconstruction with linear plating system Application of prevena incisional management system Multilevel rib block with Exparel solution  Surgeon:        B. Lorayne Marek, MD  Assistant:       Jacques Earthly, PA-C  Anesthesia:    get  Operative Findings: ? preserved left ventricular systolic function ? good quality internal mammary artery conduits ? Good quality left radial artery graft ? good quality saphenous vein conduit ? good quality target vessels for grafting Discharge Exam: Blood pressure (!) 133/83, pulse 93, temperature 98.3 F (36.8 C), temperature source Oral, resp. rate 20, height 5\' 5"  (1.651 m), weight (!) 104.3 kg, SpO2 96 %.  General appearance: alert, cooperative and no distress Heart: regular rate and rhythm Lungs: clear to auscultation bilaterally Abdomen: soft, non-tender; bowel sounds normal; no masses,  no organomegaly Extremities: edema trace Wound: clean and dry, some skin tearing from pravena wound vac present, mild skin breakdown at bottom of sternotomy  Discharge  Medications:  Allergies as of 09/30/2019      Reactions   Losartan Rash      Medication List    STOP taking these medications   isosorbide mononitrate 30 MG 24 hr tablet Commonly known as: IMDUR     TAKE these medications   acetaminophen 500 MG tablet Commonly known as: TYLENOL Take 2 tablets (1,000 mg total) by mouth every 6 (six) hours as needed for mild pain, fever or headache.   aspirin EC 81 MG tablet Take 81 mg by mouth daily. Swallow whole.   atorvastatin 10 MG tablet Commonly known as: LIPITOR Take 10 mg by mouth at bedtime.   Calcium-Vitamin D 600-400 MG-UNIT Tabs Take 1 tablet by mouth daily.   cephALEXin 500 MG capsule Commonly known as: Keflex Take 1 capsule (500 mg total) by mouth 3 (three) times daily.   clopidogrel 75 MG tablet Commonly known as: PLAVIX Take 1 tablet (75 mg total) by mouth daily.   colchicine 0.6 MG tablet Take 1 tablet (0.6 mg total) by mouth 2 (two) times daily.   furosemide 40 MG tablet Commonly known as: LASIX Take 1 tablet (40 mg total) by mouth daily.   isosorbide dinitrate 10 MG tablet Commonly known as: ISORDIL Take 1 tablet (10 mg total) by mouth 3 (three) times daily.  lisinopril 5 MG tablet Commonly known as: ZESTRIL Take 1 tablet (5 mg total) by mouth daily.   metoprolol succinate 25 MG 24 hr tablet Commonly known as: TOPROL-XL Take 25 mg by mouth daily.   multivitamin with minerals tablet Take 1 tablet by mouth daily.   omeprazole 20 MG capsule Commonly known as: PRILOSEC Take 20 mg by mouth daily.   Potassium Chloride ER 20 MEQ Tbcr Take 20 mEq by mouth daily.   traMADol 50 MG tablet Commonly known as: ULTRAM Take 1 tablet (50 mg total) by mouth every 4 (four) hours as needed for moderate pain.       Follow Up Appointments:   Follow-up Information    Fath, Darlin Priestly, MD Follow up.   Specialty: Cardiology Why: Appointment to see your cardiologist on 10/10/2019 at 10:45 AM. Contact  information: [address] [place] [address] [phone]        [name], MD Follow up on 10/08/2019.   Specialty: Cardiothoracic Surgery Why: Appointment is at 2:00 Contact information: [address] [place] [address] [place] [address] [phone]              The patient has been discharged on:   1.Beta Blocker:  Yes [ X  ]                              No   [   ]                              If No, reason:  2.Ace Inhibitor/ARB: Yes [ X  ]                                     No  [    ]                                     If No, reason:  3.Statin:   Yes [ X  ]                  No  [   ]                  If No, reason:  4.Ecasa:  Yes  [ X  ]                  No   [   ]                  If No, reason:  Signed:  Original note by Gershon Crane PA-C  Update by:  Lowella Dandy, PA-C   09/30/2019, 9:10 AM

## 2019-09-27 ENCOUNTER — Inpatient Hospital Stay (HOSPITAL_COMMUNITY): Payer: 59

## 2019-09-27 LAB — CBC
HCT: 29.1 % — ABNORMAL LOW (ref 36.0–46.0)
Hemoglobin: 9.3 g/dL — ABNORMAL LOW (ref 12.0–15.0)
MCH: 30.1 pg (ref 26.0–34.0)
MCHC: 32 g/dL (ref 30.0–36.0)
MCV: 94.2 fL (ref 80.0–100.0)
Platelets: 149 10*3/uL — ABNORMAL LOW (ref 150–400)
RBC: 3.09 MIL/uL — ABNORMAL LOW (ref 3.87–5.11)
RDW: 13.2 % (ref 11.5–15.5)
WBC: 11.2 10*3/uL — ABNORMAL HIGH (ref 4.0–10.5)
nRBC: 0 % (ref 0.0–0.2)

## 2019-09-27 LAB — BASIC METABOLIC PANEL
Anion gap: 5 (ref 5–15)
BUN: 10 mg/dL (ref 6–20)
CO2: 25 mmol/L (ref 22–32)
Calcium: 8.2 mg/dL — ABNORMAL LOW (ref 8.9–10.3)
Chloride: 101 mmol/L (ref 98–111)
Creatinine, Ser: 0.55 mg/dL (ref 0.44–1.00)
GFR calc Af Amer: >60 mL/min (ref >60)
GFR calc non Af Amer: >60 mL/min (ref >60)
Glucose, Bld: 103 mg/dL — ABNORMAL HIGH (ref 70–99)
Potassium: 4.8 mmol/L (ref 3.5–5.1)
Sodium: 131 mmol/L — ABNORMAL LOW (ref 135–145)

## 2019-09-27 LAB — GLUCOSE, CAPILLARY
Glucose-Capillary: 105 mg/dL — ABNORMAL HIGH (ref 70–99)
Glucose-Capillary: 109 mg/dL — ABNORMAL HIGH (ref 70–99)
Glucose-Capillary: 68 mg/dL — ABNORMAL LOW (ref 70–99)
Glucose-Capillary: 81 mg/dL (ref 70–99)
Glucose-Capillary: 86 mg/dL (ref 70–99)
Glucose-Capillary: 92 mg/dL (ref 70–99)
Glucose-Capillary: 92 mg/dL (ref 70–99)
Glucose-Capillary: 95 mg/dL (ref 70–99)

## 2019-09-27 MED ORDER — COLCHICINE 0.3 MG HALF TABLET
0.3000 mg | ORAL_TABLET | Freq: Two times a day (BID) | ORAL | Status: DC
  Administered 2019-09-27 – 2019-09-28 (×3): 0.3 mg via ORAL
  Filled 2019-09-27 (×4): qty 1

## 2019-09-27 MED ORDER — FUROSEMIDE 10 MG/ML IJ SOLN
40.0000 mg | Freq: Two times a day (BID) | INTRAMUSCULAR | Status: DC
  Administered 2019-09-27 – 2019-09-29 (×5): 40 mg via INTRAVENOUS
  Filled 2019-09-27 (×5): qty 4

## 2019-09-27 MED ORDER — KETOROLAC TROMETHAMINE 15 MG/ML IJ SOLN
7.5000 mg | Freq: Four times a day (QID) | INTRAMUSCULAR | Status: AC
  Administered 2019-09-27 – 2019-09-28 (×5): 7.5 mg via INTRAVENOUS
  Filled 2019-09-27 (×5): qty 1

## 2019-09-27 MED FILL — Potassium Chloride Inj 2 mEq/ML: INTRAVENOUS | Qty: 40 | Status: AC

## 2019-09-27 MED FILL — Electrolyte-R (PH 7.4) Solution: INTRAVENOUS | Qty: 6000 | Status: AC

## 2019-09-27 MED FILL — Mannitol IV Soln 20%: INTRAVENOUS | Qty: 500 | Status: AC

## 2019-09-27 MED FILL — Magnesium Sulfate Inj 50%: INTRAMUSCULAR | Qty: 10 | Status: AC

## 2019-09-27 MED FILL — Sodium Chloride IV Soln 0.9%: INTRAVENOUS | Qty: 2000 | Status: AC

## 2019-09-27 MED FILL — Lidocaine HCl Local Soln Prefilled Syringe 100 MG/5ML (2%): INTRAMUSCULAR | Qty: 5 | Status: AC

## 2019-09-27 MED FILL — Heparin Sodium (Porcine) Inj 1000 Unit/ML: INTRAMUSCULAR | Qty: 30 | Status: AC

## 2019-09-27 MED FILL — Sodium Bicarbonate IV Soln 8.4%: INTRAVENOUS | Qty: 50 | Status: AC

## 2019-09-27 NOTE — Progress Notes (Signed)
2 Days Post-Op Procedure(s) (LRB): CORONARY ARTERY BYPASS GRAFTING (CABG), ON PUMP, TIMES FIVE, USING BILATERAL INTERNAL MAMMARY ARTERIES, ENDOSCOPICALLY HARVESTED RIGHT GREATER SAPHENOUS VEIN, AND LEFT RADIAL ARTERY HARVESTING (OPEN) (N/A) RADIAL ARTERY HARVEST (Left) MAZE (N/A) TRANSESOPHAGEAL ECHOCARDIOGRAM (TEE) (N/A) INDOCYANINE GREEN FLUORESCENCE IMAGING (ICG) (N/A) CLIPPING OF ATRIAL APPENDAGE (Left) Subjective: Incisional pain  Objective: Vital signs in last 24 hours: Temp:  [98.1 F (36.7 C)-99.2 F (37.3 C)] 98.7 F (37.1 C) (07/29 0800) Pulse Rate:  [72-92] 80 (07/29 0800) Cardiac Rhythm: Normal sinus rhythm (07/29 0400) Resp:  [8-33] 11 (07/29 0800) BP: (86-116)/(56-75) 114/74 (07/29 0800) SpO2:  [96 %-100 %] 98 % (07/29 0800) Weight:  [108.6 kg] 108.6 kg (07/29 0500)  Hemodynamic parameters for last 24 hours:    Intake/Output from previous day: 07/28 0701 - 07/29 0700 In: 694.8 [I.V.:494.8; IV Piggyback:200] Out: 1650 [Urine:980; Chest Tube:670] Intake/Output this shift: No intake/output data recorded.  General appearance: alert and cooperative Neurologic: intact Heart: regular rate and rhythm, S1, S2 normal, no murmur, click, rub or gallop Lungs: diminished breath sounds bibasilar Abdomen: soft, non-tender; bowel sounds normal; no masses,  no organomegaly Extremities: edema 1+ and no edema, redness or tenderness in the calves or thighs Wound: dressed, dry  Lab Results: Recent Labs    09/26/19 1653 09/27/19 0318  WBC 11.0* 11.2*  HGB 9.8* 9.3*  HCT 30.8* 29.1*  PLT 154 149*   BMET:  Recent Labs    09/26/19 1653 09/27/19 0318  NA 133* 131*  K 4.7 4.8  CL 103 101  CO2 24 25  GLUCOSE 110* 103*  BUN 10 10  CREATININE 0.55 0.55  CALCIUM 7.9* 8.2*    PT/INR:  Recent Labs    09/25/19 1411  LABPROT 18.3*  INR 1.6*   ABG    Component Value Date/Time   PHART 7.326 (L) 09/25/2019 1907   HCO3 23.1 09/25/2019 1907   TCO2 24 09/25/2019 1907    ACIDBASEDEF 3.0 (H) 09/25/2019 1907   O2SAT 99.0 09/25/2019 1907   CBG (last 3)  Recent Labs    09/27/19 0335 09/27/19 0655 09/27/19 0802  GLUCAP 92 109* 105*    Assessment/Plan: S/P Procedure(s) (LRB): CORONARY ARTERY BYPASS GRAFTING (CABG), ON PUMP, TIMES FIVE, USING BILATERAL INTERNAL MAMMARY ARTERIES, ENDOSCOPICALLY HARVESTED RIGHT GREATER SAPHENOUS VEIN, AND LEFT RADIAL ARTERY HARVESTING (OPEN) (N/A) RADIAL ARTERY HARVEST (Left) MAZE (N/A) TRANSESOPHAGEAL ECHOCARDIOGRAM (TEE) (N/A) INDOCYANINE GREEN FLUORESCENCE IMAGING (ICG) (N/A) CLIPPING OF ATRIAL APPENDAGE (Left) Mobilize Diuresis PT/OT   LOS: 2 days    [name] 09/27/2019

## 2019-09-27 NOTE — Progress Notes (Signed)
Patient ID: Karen Galloway, female   DOB: [birth year], 59 y.o.   MRN: [id number] [facility] Evening Rounds:  Hemodynamically stable in sinus rhythm.  sats 100%  Good urine output.

## 2019-09-27 NOTE — Progress Notes (Signed)
Hypoglycemic Event  CBG: 68  Treatment: 4 oz Orange Juice Symptoms: asymptomatic Follow-up CBG: 81 Time 1623

## 2019-09-28 ENCOUNTER — Inpatient Hospital Stay (HOSPITAL_COMMUNITY): Payer: 59

## 2019-09-28 LAB — POCT I-STAT, CHEM 8
BUN: 15 mg/dL (ref 6–20)
BUN: 12 mg/dL (ref 6–20)
BUN: 14 mg/dL (ref 6–20)
BUN: 11 mg/dL (ref 6–20)
BUN: 12 mg/dL (ref 6–20)
BUN: 11 mg/dL (ref 6–20)
Calcium, Ion: 1.16 mmol/L (ref 1.15–1.40)
Calcium, Ion: 0.95 mmol/L — ABNORMAL LOW (ref 1.15–1.40)
Calcium, Ion: 1.2 mmol/L (ref 1.15–1.40)
Calcium, Ion: 0.98 mmol/L — ABNORMAL LOW (ref 1.15–1.40)
Calcium, Ion: 1.06 mmol/L — ABNORMAL LOW (ref 1.15–1.40)
Calcium, Ion: 0.98 mmol/L — ABNORMAL LOW (ref 1.15–1.40)
Chloride: 105 mmol/L (ref 98–111)
Chloride: 100 mmol/L (ref 98–111)
Chloride: 104 mmol/L (ref 98–111)
Chloride: 101 mmol/L (ref 98–111)
Chloride: 101 mmol/L (ref 98–111)
Chloride: 102 mmol/L (ref 98–111)
Creatinine, Ser: 0.4 mg/dL — ABNORMAL LOW (ref 0.44–1.00)
Creatinine, Ser: 0.2 mg/dL — ABNORMAL LOW (ref 0.44–1.00)
Creatinine, Ser: 0.4 mg/dL — ABNORMAL LOW (ref 0.44–1.00)
Creatinine, Ser: 0.3 mg/dL — ABNORMAL LOW (ref 0.44–1.00)
Creatinine, Ser: 0.3 mg/dL — ABNORMAL LOW (ref 0.44–1.00)
Creatinine, Ser: 0.3 mg/dL — ABNORMAL LOW (ref 0.44–1.00)
Glucose, Bld: 117 mg/dL — ABNORMAL HIGH (ref 70–99)
Glucose, Bld: 104 mg/dL — ABNORMAL HIGH (ref 70–99)
Glucose, Bld: 90 mg/dL (ref 70–99)
Glucose, Bld: 147 mg/dL — ABNORMAL HIGH (ref 70–99)
Glucose, Bld: 153 mg/dL — ABNORMAL HIGH (ref 70–99)
Glucose, Bld: 120 mg/dL — ABNORMAL HIGH (ref 70–99)
HCT: 43 % (ref 36.0–46.0)
HCT: 29 % — ABNORMAL LOW (ref 36.0–46.0)
HCT: 35 % — ABNORMAL LOW (ref 36.0–46.0)
HCT: 32 % — ABNORMAL LOW (ref 36.0–46.0)
HCT: 31 % — ABNORMAL LOW (ref 36.0–46.0)
HCT: 27 % — ABNORMAL LOW (ref 36.0–46.0)
Hemoglobin: 14.6 g/dL (ref 12.0–15.0)
Hemoglobin: 11.9 g/dL — ABNORMAL LOW (ref 12.0–15.0)
Hemoglobin: 9.9 g/dL — ABNORMAL LOW (ref 12.0–15.0)
Hemoglobin: 10.9 g/dL — ABNORMAL LOW (ref 12.0–15.0)
Hemoglobin: 10.5 g/dL — ABNORMAL LOW (ref 12.0–15.0)
Hemoglobin: 9.2 g/dL — ABNORMAL LOW (ref 12.0–15.0)
Potassium: 3.6 mmol/L (ref 3.5–5.1)
Potassium: 3.9 mmol/L (ref 3.5–5.1)
Potassium: 3.9 mmol/L (ref 3.5–5.1)
Potassium: 4.6 mmol/L (ref 3.5–5.1)
Potassium: 4.6 mmol/L (ref 3.5–5.1)
Potassium: 4.6 mmol/L (ref 3.5–5.1)
Sodium: 140 mmol/L (ref 135–145)
Sodium: 140 mmol/L (ref 135–145)
Sodium: 142 mmol/L (ref 135–145)
Sodium: 137 mmol/L (ref 135–145)
Sodium: 136 mmol/L (ref 135–145)
Sodium: 138 mmol/L (ref 135–145)
TCO2: 23 mmol/L (ref 22–32)
TCO2: 23 mmol/L (ref 22–32)
TCO2: 26 mmol/L (ref 22–32)
TCO2: 24 mmol/L (ref 22–32)
TCO2: 24 mmol/L (ref 22–32)
TCO2: 23 mmol/L (ref 22–32)

## 2019-09-28 LAB — BASIC METABOLIC PANEL
Anion gap: 7 (ref 5–15)
BUN: 13 mg/dL (ref 6–20)
CO2: 29 mmol/L (ref 22–32)
Calcium: 8.3 mg/dL — ABNORMAL LOW (ref 8.9–10.3)
Chloride: 99 mmol/L (ref 98–111)
Creatinine, Ser: 0.64 mg/dL (ref 0.44–1.00)
GFR calc Af Amer: >60 mL/min (ref >60)
GFR calc non Af Amer: >60 mL/min (ref >60)
Glucose, Bld: 97 mg/dL (ref 70–99)
Potassium: 3.9 mmol/L (ref 3.5–5.1)
Sodium: 135 mmol/L (ref 135–145)

## 2019-09-28 LAB — POCT I-STAT 7, (LYTES, BLD GAS, ICA,H+H)
Acid-Base Excess: 2 mmol/L (ref 0.0–2.0)
Acid-Base Excess: 0 mmol/L (ref 0.0–2.0)
Acid-base deficit: 2 mmol/L (ref 0.0–2.0)
Acid-base deficit: 1 mmol/L (ref 0.0–2.0)
Acid-base deficit: 2 mmol/L (ref 0.0–2.0)
Bicarbonate: 22.5 mmol/L (ref 20.0–28.0)
Bicarbonate: 27.1 mmol/L (ref 20.0–28.0)
Bicarbonate: 24.3 mmol/L (ref 20.0–28.0)
Bicarbonate: 24.7 mmol/L (ref 20.0–28.0)
Bicarbonate: 23.3 mmol/L (ref 20.0–28.0)
Calcium, Ion: 1.18 mmol/L (ref 1.15–1.40)
Calcium, Ion: 0.94 mmol/L — ABNORMAL LOW (ref 1.15–1.40)
Calcium, Ion: 0.98 mmol/L — ABNORMAL LOW (ref 1.15–1.40)
Calcium, Ion: 0.99 mmol/L — ABNORMAL LOW (ref 1.15–1.40)
Calcium, Ion: 0.95 mmol/L — ABNORMAL LOW (ref 1.15–1.40)
HCT: 37 % (ref 36.0–46.0)
HCT: 27 % — ABNORMAL LOW (ref 36.0–46.0)
HCT: 29 % — ABNORMAL LOW (ref 36.0–46.0)
HCT: 28 % — ABNORMAL LOW (ref 36.0–46.0)
HCT: 25 % — ABNORMAL LOW (ref 36.0–46.0)
Hemoglobin: 12.6 g/dL (ref 12.0–15.0)
Hemoglobin: 9.2 g/dL — ABNORMAL LOW (ref 12.0–15.0)
Hemoglobin: 9.9 g/dL — ABNORMAL LOW (ref 12.0–15.0)
Hemoglobin: 9.5 g/dL — ABNORMAL LOW (ref 12.0–15.0)
Hemoglobin: 8.5 g/dL — ABNORMAL LOW (ref 12.0–15.0)
O2 Saturation: 100 %
O2 Saturation: 100 %
O2 Saturation: 100 %
O2 Saturation: 100 %
O2 Saturation: 100 %
Potassium: 3.7 mmol/L (ref 3.5–5.1)
Potassium: 4 mmol/L (ref 3.5–5.1)
Potassium: 4.7 mmol/L (ref 3.5–5.1)
Potassium: 5.1 mmol/L (ref 3.5–5.1)
Potassium: 4.7 mmol/L (ref 3.5–5.1)
Sodium: 140 mmol/L (ref 135–145)
Sodium: 141 mmol/L (ref 135–145)
Sodium: 137 mmol/L (ref 135–145)
Sodium: 136 mmol/L (ref 135–145)
Sodium: 139 mmol/L (ref 135–145)
TCO2: 24 mmol/L (ref 22–32)
TCO2: 28 mmol/L (ref 22–32)
TCO2: 26 mmol/L (ref 22–32)
TCO2: 26 mmol/L (ref 22–32)
TCO2: 24 mmol/L (ref 22–32)
pCO2 arterial: 38.5 mmHg (ref 32.0–48.0)
pCO2 arterial: 43.5 mmHg (ref 32.0–48.0)
pCO2 arterial: 42.8 mmHg (ref 32.0–48.0)
pCO2 arterial: 40.1 mmHg (ref 32.0–48.0)
pCO2 arterial: 39.6 mmHg (ref 32.0–48.0)
pH, Arterial: 7.375 (ref 7.350–7.450)
pH, Arterial: 7.402 (ref 7.350–7.450)
pH, Arterial: 7.361 (ref 7.350–7.450)
pH, Arterial: 7.397 (ref 7.350–7.450)
pH, Arterial: 7.377 (ref 7.350–7.450)
pO2, Arterial: 197 mmHg — ABNORMAL HIGH (ref 83.0–108.0)
pO2, Arterial: 381 mmHg — ABNORMAL HIGH (ref 83.0–108.0)
pO2, Arterial: 339 mmHg — ABNORMAL HIGH (ref 83.0–108.0)
pO2, Arterial: 349 mmHg — ABNORMAL HIGH (ref 83.0–108.0)
pO2, Arterial: 184 mmHg — ABNORMAL HIGH (ref 83.0–108.0)

## 2019-09-28 LAB — LIPID PANEL
Cholesterol: 122 mg/dL (ref 0–200)
HDL: 27 mg/dL — ABNORMAL LOW (ref >40)
LDL Cholesterol: 74 mg/dL (ref 0–99)
Total CHOL/HDL Ratio: 4.5 RATIO
Triglycerides: 103 mg/dL (ref <150)
VLDL: 21 mg/dL (ref 0–40)

## 2019-09-28 LAB — CBC
HCT: 26.8 % — ABNORMAL LOW (ref 36.0–46.0)
Hemoglobin: 8.6 g/dL — ABNORMAL LOW (ref 12.0–15.0)
MCH: 29.9 pg (ref 26.0–34.0)
MCHC: 32.1 g/dL (ref 30.0–36.0)
MCV: 93.1 fL (ref 80.0–100.0)
Platelets: 146 10*3/uL — ABNORMAL LOW (ref 150–400)
RBC: 2.88 MIL/uL — ABNORMAL LOW (ref 3.87–5.11)
RDW: 12.9 % (ref 11.5–15.5)
WBC: 8.5 10*3/uL (ref 4.0–10.5)
nRBC: 0 % (ref 0.0–0.2)

## 2019-09-28 LAB — GLUCOSE, CAPILLARY
Glucose-Capillary: 92 mg/dL (ref 70–99)
Glucose-Capillary: 75 mg/dL (ref 70–99)
Glucose-Capillary: 119 mg/dL — ABNORMAL HIGH (ref 70–99)
Glucose-Capillary: 87 mg/dL (ref 70–99)

## 2019-09-28 MED ORDER — COLCHICINE 0.6 MG PO TABS
0.6000 mg | ORAL_TABLET | Freq: Two times a day (BID) | ORAL | Status: DC
  Administered 2019-09-28 – 2019-09-30 (×4): 0.6 mg via ORAL
  Filled 2019-09-28 (×4): qty 1

## 2019-09-28 MED ORDER — PROMETHAZINE HCL 25 MG/ML IJ SOLN: 6.2500 mg | Freq: Four times a day (QID) | INTRAMUSCULAR | Status: DC | PRN

## 2019-09-28 NOTE — Progress Notes (Signed)
CARDIAC REHAB PHASE I   PRE:  Rate/Rhythm: 84 SR  BP:  Sitting: 100/65      SaO2: 95 RA  MODE:  Ambulation: 120 ft   POST:  Rate/Rhythm: 104 ST  BP:  Sitting: 120/82    SaO2: 94 RA  Pt ambulated 131ft in hallway assist of one with front wheel walker. Pt denies dizziness, SOB, or nausea. Does endorse some incisional pain, two short standing rest breaks taken. Pt helped back into bed, able to maintain sternal precautions. Encouraged continued IS use and ambulation. Will continue to follow.  [id number] Reynold Bowen, RN BSN 09/28/2019 2:10 PM

## 2019-09-28 NOTE — Evaluation (Signed)
Physical Therapy Evaluation Patient Details Name: Karen Galloway MRN: [id number] DOB: [birth year] Today's Date: 09/28/2019   History of Present Illness  59 yo admitted for CABG x 5 with MAZE and left radial artery harvest. PMHx: CAD, HTN  Clinical Impression  Pt pleasant in bed on arrival and reports some nausea without emesis. Pt on RA with sats 88-95% with inaccurate pleth at times and consistently above 90% with good wave form. Pt with decreased transfers, strength, function and gait who will benefit from acute therapy to maximize mobility safety and function to decrease burden of care. Pt educated for all precautions and use of IS. Pt demonstrating only 500 cc on IS use and educated for its importance. Pt plans to use equipment mom owns and denied need for her own RW.     Follow Up Recommendations Home health PT    Equipment Recommendations  None recommended by PT    Recommendations for Other Services       Precautions / Restrictions Precautions Precautions: Sternal;Fall Precaution Booklet Issued: Yes (comment) Precaution Comments: VAC Restrictions Weight Bearing Restrictions: Yes Other Position/Activity Restrictions: sternal precautions      Mobility  Bed Mobility Overal bed mobility: Needs Assistance Bed Mobility: Rolling;Sidelying to Sit Rolling: Min assist Sidelying to sit: Min assist;HOB elevated       General bed mobility comments: cues for sequence with assist to rotate pelvis and elevate trunk with HOB 20 degrees, increased time  Transfers Overall transfer level: Needs assistance   Transfers: Sit to/from Stand Sit to Stand: Min assist         General transfer comment: min assist to rise with cues for hands on thighs  Ambulation/Gait Ambulation/Gait assistance: Min assist Gait Distance (Feet): 130 Feet Assistive device: Rolling walker (2 wheeled) Gait Pattern/deviations: Step-through pattern;Decreased stride length   Gait velocity interpretation:  <1.8 ft/sec, indicate of risk for recurrent falls General Gait Details: cues for safety with assist to direct RW due to added weight of VAC attached to RW  Stairs            Wheelchair Mobility    Modified Rankin (Stroke Patients Only)       Balance Overall balance assessment: Needs assistance Sitting-balance support: No upper extremity supported;Feet supported Sitting balance-Leahy Scale: Fair       Standing balance-Leahy Scale: Fair Standing balance comment: pt able to stand at sink without UE support, RW for gait                             Pertinent Vitals/Pain Pain Assessment: 0-10 Pain Score: 5  Pain Location: sternal Pain Descriptors / Indicators: Discomfort Pain Intervention(s): Limited activity within patient's tolerance;Monitored during session;Repositioned    Home Living Family/patient expects to be discharged to:: Private residence Living Arrangements: Alone Available Help at Discharge: Family;Available 24 hours/day Type of Home: House Home Access: Stairs to enter Entrance Stairs-Rails: Right Entrance Stairs-Number of Steps: 4 Home Layout: One level Home Equipment: None      Prior Function Level of Independence: Independent         Comments: working; driving     Higher education careers adviser   Dominant Hand: Right    Extremity/Trunk Assessment   Upper Extremity Assessment Upper Extremity Assessment: Defer to OT evaluation    Lower Extremity Assessment Lower Extremity Assessment: Generalized weakness    Cervical / Trunk Assessment Cervical / Trunk Assessment:  (forward head)  Communication   Communication: No difficulties  Cognition  Arousal/Alertness: Awake/alert Behavior During Therapy: WFL for tasks assessed/performed Overall Cognitive Status: Within Functional Limits for tasks assessed                                        General Comments      Exercises     Assessment/Plan    PT Assessment Patient  needs continued PT services  PT Problem List Decreased strength;Decreased mobility;Decreased activity tolerance;Decreased knowledge of use of DME;Pain;Decreased knowledge of precautions       PT Treatment Interventions Gait training;Stair training;Functional mobility training;Therapeutic activities;Patient/family education;DME instruction;Therapeutic exercise    PT Goals (Current goals can be found in the Care Plan section)  Acute Rehab PT Goals Patient Stated Goal: return to work and gardening PT Goal Formulation: With patient Time For Goal Achievement: 10/12/19 Potential to Achieve Goals: Good    Frequency Min 3X/week   Barriers to discharge        Co-evaluation               AM-PAC PT "6 Clicks" Mobility  Outcome Measure Help needed turning from your back to your side while in a flat bed without using bedrails?: A Little Help needed moving from lying on your back to sitting on the side of a flat bed without using bedrails?: A Little Help needed moving to and from a bed to a chair (including a wheelchair)?: A Little Help needed standing up from a chair using your arms (e.g., wheelchair or bedside chair)?: A Little Help needed to walk in hospital room?: A Little Help needed climbing 3-5 steps with a railing? : A Lot 6 Click Score: 17    End of Session Equipment Utilized During Treatment: Gait belt Activity Tolerance: Patient tolerated treatment well Patient left: in chair;with call bell/phone within reach;with nursing/sitter in room;with family/visitor present Nurse Communication: Mobility status;Precautions PT Visit Diagnosis: Other abnormalities of gait and mobility (R26.89);Difficulty in walking, not elsewhere classified (R26.2);Muscle weakness (generalized) (M62.81)    Time: 8657-8469 PT Time Calculation (min) (ACUTE ONLY): 30 min   Charges:   PT Evaluation $PT Eval Moderate Complexity: 1 Mod          Karoline Fleer P, PT Acute Rehabilitation Services Pager:  [phone] Office: [phone]   Enedina Finner Tysheka Fanguy 09/28/2019, 11:36 AM

## 2019-09-28 NOTE — Progress Notes (Signed)
[address]       [place] [address]             [phone]      3 Days Post-Op Procedure(s) (LRB): CORONARY ARTERY BYPASS GRAFTING (CABG), ON PUMP, TIMES FIVE, USING BILATERAL INTERNAL MAMMARY ARTERIES, ENDOSCOPICALLY HARVESTED RIGHT GREATER SAPHENOUS VEIN, AND LEFT RADIAL ARTERY HARVESTING (OPEN) (N/A) RADIAL ARTERY HARVEST (Left) MAZE (N/A) TRANSESOPHAGEAL ECHOCARDIOGRAM (TEE) (N/A) INDOCYANINE GREEN FLUORESCENCE IMAGING (ICG) (N/A) CLIPPING OF ATRIAL APPENDAGE (Left) Subjective: Slowly feeling a little better  Objective: Vital signs in last 24 hours: Temp:  [98.6 F (37 C)-99.4 F (37.4 C)] 98.7 F (37.1 C) (07/30 0400) Pulse Rate:  [76-91] 79 (07/30 0500) Cardiac Rhythm: Normal sinus rhythm (07/29 2000) Resp:  [11-25] 19 (07/30 0500) BP: (84-119)/(51-88) 98/62 (07/30 0500) SpO2:  [89 %-100 %] 100 % (07/30 0500) Weight:  [701 kg] 107 kg (07/30 0600)  Hemodynamic parameters for last 24 hours:    Intake/Output from previous day: 07/29 0701 - 07/30 0700 In: 455.3 [I.V.:455.3] Out: 2275 [Urine:2275] Intake/Output this shift: No intake/output data recorded.  General appearance: alert, cooperative, fatigued and no distress Heart: regular rate and rhythm Lungs: dim in bases Abdomen: benign Extremities: edema conts to improve Wound: left arm incis healing well, N/V intact, incis Vac on chest  Lab Results: Recent Labs    09/27/19 0318 09/28/19 0425  WBC 11.2* 8.5  HGB 9.3* 8.6*  HCT 29.1* 26.8*  PLT 149* 146*   BMET:  Recent Labs    09/27/19 0318 09/28/19 0425  NA 131* 135  K 4.8 3.9  CL 101 99  CO2 25 29  GLUCOSE 103* 97  BUN 10 13  CREATININE 0.55 0.64  CALCIUM 8.2* 8.3*    PT/INR:  Recent Labs    09/25/19 1411  LABPROT 18.3*  INR 1.6*   ABG    Component Value Date/Time   PHART 7.326 (L) 09/25/2019 1907   HCO3 23.1 09/25/2019 1907   TCO2 24 09/25/2019 1907   ACIDBASEDEF 3.0 (H) 09/25/2019 1907   O2SAT 99.0  09/25/2019 1907   CBG (last 3)  Recent Labs    09/27/19 2347 09/28/19 0415 09/28/19 0652  GLUCAP 92 92 75    Meds Scheduled Meds: . acetaminophen  1,000 mg Oral Q6H   Or  . acetaminophen (TYLENOL) oral liquid 160 mg/5 mL  1,000 mg Per Tube Q6H  . aspirin EC  81 mg Oral Daily  . atorvastatin  10 mg Oral QHS  . bisacodyl  10 mg Oral Daily   Or  . bisacodyl  10 mg Rectal Daily  . Chlorhexidine Gluconate Cloth  6 each Topical Daily  . Chlorhexidine Gluconate Cloth  6 each Topical Daily  . clopidogrel  75 mg Oral Daily  . colchicine  0.3 mg Oral BID  . docusate sodium  200 mg Oral Daily  . furosemide  40 mg Intravenous BID  . insulin aspart  0-24 Units Subcutaneous Q4H  . isosorbide dinitrate  10 mg Oral TID  . ketorolac  7.5 mg Intravenous Q6H  . mouth rinse  15 mL Mouth Rinse BID  . metoprolol tartrate  12.5 mg Oral BID   Or  . metoprolol tartrate  12.5 mg Per Tube BID  . multivitamin with minerals  1 tablet Oral Daily  . pantoprazole  40 mg Oral Daily  . sodium chloride flush  10-40 mL Intracatheter Q12H  . sodium chloride flush  3 mL Intravenous Q12H  Continuous Infusions: . sodium chloride Stopped (09/26/19 0806)  . sodium chloride    . sodium chloride 20 mL/hr at 09/25/19 1400  . lactated ringers    . lactated ringers Stopped (09/28/19 0548)  . lactated ringers     PRN Meds:.sodium chloride, lactated ringers, metoprolol tartrate, morphine injection, ondansetron (ZOFRAN) IV, oxyCODONE, sodium chloride flush, sodium chloride flush, traMADol  Xrays DG Chest 1 View  Result Date: 09/28/2019 CLINICAL DATA:  Sore chest.  Open-heart surgery. EXAM: CHEST  1 VIEW COMPARISON:  09/27/2019. FINDINGS: Interim removal of mediastinal drainage catheter and bilateral chest tubes. No pneumothorax. Right IJ sheath in stable position. Prior CABG. Left atrial appendage clip in stable position. Stable cardiomegaly. Low lung volumes with bibasilar atelectasis/infiltrates. No prominent  pleural effusion. IMPRESSION: 1. Interim removal of mediastinal drainage catheter bilateral chest tubes. No pneumothorax. Right IJ sheath in stable position. 2. Prior CABG. Left atrial appendage clip in stable position. Stable cardiomegaly. 3.  Low lung volumes with bibasilar atelectasis/infiltrates. Electronically Signed   By: Maisie Fus  Register   On: 09/28/2019 07:17   DG Chest 1 View  Result Date: 09/27/2019 CLINICAL DATA:  Chest tube.  Cardiac surgery. EXAM: CHEST  1 VIEW COMPARISON:  09/26/2019. FINDINGS: Interim removal Swan-Ganz catheter. Right IJ sheath in stable position. Mediastinal drainage catheter and bilateral chest tubes in stable position. Prior CABG. Left atrial appendage clip in stable position. Stable cardiomegaly. Persistent bibasilar atelectasis/infiltrates again noted. No interim change. No pleural effusion or pneumothorax. IMPRESSION: 1. Interim removal Swan-Ganz catheter. Right IJ sheath in stable position. Mediastinal drainage catheter bilateral chest tubes in stable position. No pneumothorax. 2.  Prior CABG.  Stable cardiomegaly. 3. Persistent bibasilar atelectasis/infiltrates. No interim change. Electronically Signed   By: Maisie Fus  Register   On: 09/27/2019 07:04    Assessment/Plan: S/P Procedure(s) (LRB): CORONARY ARTERY BYPASS GRAFTING (CABG), ON PUMP, TIMES FIVE, USING BILATERAL INTERNAL MAMMARY ARTERIES, ENDOSCOPICALLY HARVESTED RIGHT GREATER SAPHENOUS VEIN, AND LEFT RADIAL ARTERY HARVESTING (OPEN) (N/A) RADIAL ARTERY HARVEST (Left) MAZE (N/A) TRANSESOPHAGEAL ECHOCARDIOGRAM (TEE) (N/A) INDOCYANINE GREEN FLUORESCENCE IMAGING (ICG) (N/A) CLIPPING OF ATRIAL APPENDAGE (Left)  1 doing well POD #3 2 afeb, VSS but BP runs relatively low , sinus rhythm 3 sats good on 1-2  litters 4 some volume overload, cont current diuretics 5 renal fxn is normal 6 expected acute blood loss anemia- still equalibrating with diuresis 7 minor thrombocytopenia, stable 8 CXR stable atx/nfilts,  cont to mobilize and pulm toilet 9 tx to 2 C 10 hopefully home around Mon/Tues next week  LOS: 3 days    Rowe Clack PA-C Pager [phone] 09/28/2019

## 2019-09-28 NOTE — Progress Notes (Signed)
3 Days Post-Op Procedure(s) (LRB): CORONARY ARTERY BYPASS GRAFTING (CABG), ON PUMP, TIMES FIVE, USING BILATERAL INTERNAL MAMMARY ARTERIES, ENDOSCOPICALLY HARVESTED RIGHT GREATER SAPHENOUS VEIN, AND LEFT RADIAL ARTERY HARVESTING (OPEN) (N/A) RADIAL ARTERY HARVEST (Left) MAZE (N/A) TRANSESOPHAGEAL ECHOCARDIOGRAM (TEE) (N/A) INDOCYANINE GREEN FLUORESCENCE IMAGING (ICG) (N/A) CLIPPING OF ATRIAL APPENDAGE (Left) Subjective: Feeling better  Objective: Vital signs in last 24 hours: Temp:  [98.6 F (37 C)-99.4 F (37.4 C)] 98.7 F (37.1 C) (07/30 0400) Pulse Rate:  [76-91] 87 (07/30 0700) Cardiac Rhythm: Normal sinus rhythm (07/29 2000) Resp:  [11-25] 17 (07/30 0700) BP: (84-119)/(51-88) 106/63 (07/30 0700) SpO2:  [89 %-100 %] 100 % (07/30 0700) Weight:  [762 kg] 107 kg (07/30 0600)  Hemodynamic parameters for last 24 hours:    Intake/Output from previous day: 07/29 0701 - 07/30 0700 In: 455.3 [I.V.:455.3] Out: 2275 [Urine:2275] Intake/Output this shift: No intake/output data recorded.  General appearance: alert and cooperative Neurologic: intact Heart: regular rate and rhythm, S1, S2 normal, no murmur, click, rub or gallop Lungs: clear to auscultation bilaterally Abdomen: soft, non-tender; bowel sounds normal; no masses,  no organomegaly Extremities: extremities normal, atraumatic, no cyanosis or edema Wound: dressed  Lab Results: Recent Labs    09/27/19 0318 09/28/19 0425  WBC 11.2* 8.5  HGB 9.3* 8.6*  HCT 29.1* 26.8*  PLT 149* 146*   BMET:  Recent Labs    09/27/19 0318 09/28/19 0425  NA 131* 135  K 4.8 3.9  CL 101 99  CO2 25 29  GLUCOSE 103* 97  BUN 10 13  CREATININE 0.55 0.64  CALCIUM 8.2* 8.3*    PT/INR:  Recent Labs    09/25/19 1411  LABPROT 18.3*  INR 1.6*   ABG    Component Value Date/Time   PHART 7.326 (L) 09/25/2019 1907   HCO3 23.1 09/25/2019 1907   TCO2 24 09/25/2019 1907   ACIDBASEDEF 3.0 (H) 09/25/2019 1907   O2SAT 99.0 09/25/2019  1907   CBG (last 3)  Recent Labs    09/27/19 2347 09/28/19 0415 09/28/19 0652  GLUCAP 92 92 75    Assessment/Plan: S/P Procedure(s) (LRB): CORONARY ARTERY BYPASS GRAFTING (CABG), ON PUMP, TIMES FIVE, USING BILATERAL INTERNAL MAMMARY ARTERIES, ENDOSCOPICALLY HARVESTED RIGHT GREATER SAPHENOUS VEIN, AND LEFT RADIAL ARTERY HARVESTING (OPEN) (N/A) RADIAL ARTERY HARVEST (Left) MAZE (N/A) TRANSESOPHAGEAL ECHOCARDIOGRAM (TEE) (N/A) INDOCYANINE GREEN FLUORESCENCE IMAGING (ICG) (N/A) CLIPPING OF ATRIAL APPENDAGE (Left) Mobilize Diuresis Plan for transfer to step-down: see transfer orders   LOS: 3 days    [name] 09/28/2019

## 2019-09-29 ENCOUNTER — Inpatient Hospital Stay (HOSPITAL_COMMUNITY): Payer: 59

## 2019-09-29 LAB — BASIC METABOLIC PANEL
Anion gap: 9 (ref 5–15)
BUN: 15 mg/dL (ref 6–20)
CO2: 29 mmol/L (ref 22–32)
Calcium: 8.1 mg/dL — ABNORMAL LOW (ref 8.9–10.3)
Chloride: 96 mmol/L — ABNORMAL LOW (ref 98–111)
Creatinine, Ser: 0.62 mg/dL (ref 0.44–1.00)
GFR calc Af Amer: >60 mL/min (ref >60)
GFR calc non Af Amer: >60 mL/min (ref >60)
Glucose, Bld: 141 mg/dL — ABNORMAL HIGH (ref 70–99)
Potassium: 3 mmol/L — ABNORMAL LOW (ref 3.5–5.1)
Sodium: 134 mmol/L — ABNORMAL LOW (ref 135–145)

## 2019-09-29 LAB — CBC
HCT: 27.2 % — ABNORMAL LOW (ref 36.0–46.0)
Hemoglobin: 9 g/dL — ABNORMAL LOW (ref 12.0–15.0)
MCH: 30.1 pg (ref 26.0–34.0)
MCHC: 33.1 g/dL (ref 30.0–36.0)
MCV: 91 fL (ref 80.0–100.0)
Platelets: 203 10*3/uL (ref 150–400)
RBC: 2.99 MIL/uL — ABNORMAL LOW (ref 3.87–5.11)
RDW: 12.9 % (ref 11.5–15.5)
WBC: 8.1 10*3/uL (ref 4.0–10.5)
nRBC: 0 % (ref 0.0–0.2)

## 2019-09-29 LAB — GLUCOSE, CAPILLARY
Glucose-Capillary: 113 mg/dL — ABNORMAL HIGH (ref 70–99)
Glucose-Capillary: 124 mg/dL — ABNORMAL HIGH (ref 70–99)

## 2019-09-29 MED ORDER — POTASSIUM CHLORIDE CRYS ER 10 MEQ PO TBCR
EXTENDED_RELEASE_TABLET | ORAL | Status: AC
  Administered 2019-09-29: 20 meq
  Filled 2019-09-29: qty 2

## 2019-09-29 MED ORDER — FE FUMARATE-B12-VIT C-FA-IFC PO CAPS
1.0000 | ORAL_CAPSULE | Freq: Two times a day (BID) | ORAL | Status: DC
  Administered 2019-09-29 (×2): 1 via ORAL
  Filled 2019-09-29 (×3): qty 1

## 2019-09-29 MED ORDER — POTASSIUM CHLORIDE CRYS ER 20 MEQ PO TBCR
20.0000 meq | EXTENDED_RELEASE_TABLET | ORAL | Status: AC
  Administered 2019-09-29 (×2): 20 meq via ORAL
  Filled 2019-09-29 (×2): qty 1

## 2019-09-29 MED ORDER — FUROSEMIDE 40 MG PO TABS
40.0000 mg | ORAL_TABLET | Freq: Every day | ORAL | Status: DC
  Administered 2019-09-29 – 2019-09-30 (×2): 40 mg via ORAL
  Filled 2019-09-29 (×2): qty 1

## 2019-09-29 NOTE — Progress Notes (Addendum)
      [address]       [place] [address]             [phone]      4 Days Post-Op Procedure(s) (LRB): CORONARY ARTERY BYPASS GRAFTING (CABG), ON PUMP, TIMES FIVE, USING BILATERAL INTERNAL MAMMARY ARTERIES, ENDOSCOPICALLY HARVESTED RIGHT GREATER SAPHENOUS VEIN, AND LEFT RADIAL ARTERY HARVESTING (OPEN) (N/A) RADIAL ARTERY HARVEST (Left) MAZE (N/A) TRANSESOPHAGEAL ECHOCARDIOGRAM (TEE) (N/A) INDOCYANINE GREEN FLUORESCENCE IMAGING (ICG) (N/A) CLIPPING OF ATRIAL APPENDAGE (Left)   Subjective:  Doing pretty well.  Has been weaned off oxygen, looks great  Objective: Vital signs in last 24 hours: Temp:  [98.1 F (36.7 C)-99 F (37.2 C)] 98.3 F (36.8 C) (07/31 0827) Pulse Rate:  [79-98] 95 (07/31 0827) Cardiac Rhythm: Normal sinus rhythm;Sinus tachycardia (07/30 2327) Resp:  [11-24] 20 (07/31 0827) BP: (81-136)/(54-82) 136/79 (07/31 0827) SpO2:  [93 %-100 %] 95 % (07/31 0827) Weight:  [105.1 kg] 105.1 kg (07/31 0439)  Intake/Output from previous day: 07/30 0701 - 07/31 0700 In: 720 [P.O.:720] Out: 2600 [Urine:2600] Intake/Output this shift: Total I/O In: -  Out: 150 [Urine:150]  General appearance: alert, cooperative and no distress Heart: regular rate and rhythm Lungs: clear to auscultation bilaterally Abdomen: soft, non-tender; bowel sounds normal; no masses,  no organomegaly Extremities: edema trace Wound: clean and dry radial site, EVH site, pravena on sternotomy  Lab Results: Recent Labs    09/28/19 0425 09/29/19 0051  WBC 8.5 8.1  HGB 8.6* 9.0*  HCT 26.8* 27.2*  PLT 146* 203   BMET:  Recent Labs    09/28/19 0425 09/29/19 0051  NA 135 134*  K 3.9 3.0*  CL 99 96*  CO2 29 29  GLUCOSE 97 141*  BUN 13 15  CREATININE 0.64 0.62  CALCIUM 8.3* 8.1*    PT/INR: No results for input(s): LABPROT, INR in the last 72 hours. ABG    Component Value Date/Time   PHART 7.326 (L) 09/25/2019 1907   HCO3 23.1 09/25/2019 1907   TCO2 24  09/25/2019 1907   ACIDBASEDEF 3.0 (H) 09/25/2019 1907   O2SAT 99.0 09/25/2019 1907   CBG (last 3)  Recent Labs    09/28/19 1544 09/29/19 0437 09/29/19 0838  GLUCAP 87 113* 124*    Assessment/Plan: S/P Procedure(s) (LRB): CORONARY ARTERY BYPASS GRAFTING (CABG), ON PUMP, TIMES FIVE, USING BILATERAL INTERNAL MAMMARY ARTERIES, ENDOSCOPICALLY HARVESTED RIGHT GREATER SAPHENOUS VEIN, AND LEFT RADIAL ARTERY HARVESTING (OPEN) (N/A) RADIAL ARTERY HARVEST (Left) MAZE (N/A) TRANSESOPHAGEAL ECHOCARDIOGRAM (TEE) (N/A) INDOCYANINE GREEN FLUORESCENCE IMAGING (ICG) (N/A) CLIPPING OF ATRIAL APPENDAGE (Left)  1. CV- NSR, BP stable- continue Imdur for radial graft, lopressor 2. Pulm- atelectasis persists on CXR, off oxygen, continue IS 3. Renal- creatinine WNL, weight is below baseline, transition to oral Lasix 40 mg daily 4. Hypokalemia- will supplement 5. CBGs controlled, patient is not a diabetic will d/c SSIP 6. Dispo- patient stable, maintaining NSR will d/c EPW today, continue Lasix, supplement potassium, if remains stable will plan to d/c home tomorrow   LOS: 4 days    Lowella Dandy, PA-C 09/29/2019   Pt seen and examined; chart/results personally reviewed. Agree with documentation. Possible discharge home tomorrow. Zuly Belkin Z. Vickey Sages, MD [phone]

## 2019-09-29 NOTE — Progress Notes (Signed)
CARDIAC REHAB PHASE I   Offered to walk with pt, pt states she is on bedrest for EPW d/c. D/c education completed with pt and mom. Pt educated on importance of site care and monitoring incision daily. Encouraged continued IS use, walks, and sternal precautions. Pt given in-the-tube sheet along with heart healthy and diabetic diets. Reviewed restrictions and exercise guidelines. Will send letter of interest to [facility] [place]. Pt is interested in participating in Virtual Cardiac and Pulmonary Rehab. Pt advised that Virtual Cardiac and Pulmonary Rehab is provided at no cost to the patient.  Checklist:  1. Pt has smart device  ie smartphone and/or ipad for downloading an app  Yes 2. Reliable internet/wifi service    Yes 3. Understands how to use their smartphone and navigate within an app.  Yes  Pt verbalized understanding and is in agreement.  [phone] Reynold Bowen, RN BSN 09/29/2019 1:46 PM

## 2019-09-30 LAB — BASIC METABOLIC PANEL
Anion gap: 10 (ref 5–15)
BUN: 16 mg/dL (ref 6–20)
CO2: 29 mmol/L (ref 22–32)
Calcium: 8.4 mg/dL — ABNORMAL LOW (ref 8.9–10.3)
Chloride: 98 mmol/L (ref 98–111)
Creatinine, Ser: 0.61 mg/dL (ref 0.44–1.00)
GFR calc Af Amer: >60 mL/min (ref >60)
GFR calc non Af Amer: >60 mL/min (ref >60)
Glucose, Bld: 105 mg/dL — ABNORMAL HIGH (ref 70–99)
Potassium: 3.6 mmol/L (ref 3.5–5.1)
Sodium: 137 mmol/L (ref 135–145)

## 2019-09-30 MED ORDER — COLCHICINE 0.6 MG PO TABS: 0.6000 mg | ORAL_TABLET | Freq: Two times a day (BID) | ORAL | 0 refills | Status: AC

## 2019-09-30 MED ORDER — LISINOPRIL 5 MG PO TABS: 5.0000 mg | ORAL_TABLET | Freq: Every day | ORAL | 3 refills | Status: AC

## 2019-09-30 MED ORDER — ISOSORBIDE DINITRATE 10 MG PO TABS: 10.0000 mg | ORAL_TABLET | Freq: Three times a day (TID) | ORAL | 0 refills | Status: AC

## 2019-09-30 MED ORDER — ACETAMINOPHEN 500 MG PO TABS: 1000.0000 mg | ORAL_TABLET | Freq: Four times a day (QID) | ORAL | 0 refills | Status: AC | PRN

## 2019-09-30 MED ORDER — TRAMADOL HCL 50 MG PO TABS: 50.0000 mg | ORAL_TABLET | ORAL | 0 refills | Status: AC | PRN

## 2019-09-30 MED ORDER — POTASSIUM CHLORIDE ER 20 MEQ PO TBCR: 20.0000 meq | EXTENDED_RELEASE_TABLET | Freq: Every day | ORAL | 0 refills | Status: AC

## 2019-09-30 MED ORDER — CLOPIDOGREL BISULFATE 75 MG PO TABS: 75.0000 mg | ORAL_TABLET | Freq: Every day | ORAL | 3 refills | Status: AC

## 2019-09-30 MED ORDER — FUROSEMIDE 40 MG PO TABS: 40.0000 mg | ORAL_TABLET | Freq: Every day | ORAL | 0 refills | Status: AC

## 2019-09-30 MED ORDER — LISINOPRIL 5 MG PO TABS
5.0000 mg | ORAL_TABLET | Freq: Every day | ORAL | Status: DC
  Administered 2019-09-30: 5 mg via ORAL
  Filled 2019-09-30: qty 1

## 2019-09-30 MED ORDER — CEPHALEXIN 500 MG PO CAPS: 500.0000 mg | ORAL_CAPSULE | Freq: Three times a day (TID) | ORAL | 0 refills | Status: AC

## 2019-09-30 NOTE — Progress Notes (Signed)
PA Erin removed wound vac dressing and proximal area little oozing. Will order antibiotics for it. Removed chest tube stitches and PIV access. Patient received discharge instructions and understood it well. She took all her belongings. HS [employer]

## 2019-09-30 NOTE — Progress Notes (Signed)
      [address]       [place] [address]             [phone]      5 Days Post-Op Procedure(s) (LRB): CORONARY ARTERY BYPASS GRAFTING (CABG), ON PUMP, TIMES FIVE, USING BILATERAL INTERNAL MAMMARY ARTERIES, ENDOSCOPICALLY HARVESTED RIGHT GREATER SAPHENOUS VEIN, AND LEFT RADIAL ARTERY HARVESTING (OPEN) (N/A) RADIAL ARTERY HARVEST (Left) MAZE (N/A) TRANSESOPHAGEAL ECHOCARDIOGRAM (TEE) (N/A) INDOCYANINE GREEN FLUORESCENCE IMAGING (ICG) (N/A) CLIPPING OF ATRIAL APPENDAGE (Left)   Subjective:  No new complaints.  Feels good and wants to go home.  Objective: Vital signs in last 24 hours: Temp:  [98.2 F (36.8 C)-98.8 F (37.1 C)] 98.3 F (36.8 C) (08/01 0820) Pulse Rate:  [85-101] 93 (08/01 0820) Cardiac Rhythm: Normal sinus rhythm (08/01 0840) Resp:  [16-25] 20 (08/01 0820) BP: (100-145)/(58-87) 133/83 (08/01 0820) SpO2:  [93 %-98 %] 96 % (08/01 0820) Weight:  [104.3 kg] 104.3 kg (08/01 0354)  Intake/Output from previous day: 07/31 0701 - 08/01 0700 In: 890 [P.O.:890] Out: 2225 [Urine:2225] Intake/Output this shift: Total I/O In: 120 [P.O.:120] Out: -   General appearance: alert, cooperative and no distress Heart: regular rate and rhythm Lungs: clear to auscultation bilaterally Abdomen: soft, non-tender; bowel sounds normal; no masses,  no organomegaly Extremities: edema trace Wound: clean and dry, some skin tearing from pravena wound vac present, mild skin breakdown at bottom of sternotomy  Lab Results: Recent Labs    09/28/19 0425 09/29/19 0051  WBC 8.5 8.1  HGB 8.6* 9.0*  HCT 26.8* 27.2*  PLT 146* 203   BMET:  Recent Labs    09/29/19 0051 09/30/19 0120  NA 134* 137  K 3.0* 3.6  CL 96* 98  CO2 29 29  GLUCOSE 141* 105*  BUN 15 16  CREATININE 0.62 0.61  CALCIUM 8.1* 8.4*    PT/INR: No results for input(s): LABPROT, INR in the last 72 hours. ABG    Component Value Date/Time   PHART 7.326 (L) 09/25/2019 1907   HCO3 23.1  09/25/2019 1907   TCO2 24 09/25/2019 1907   ACIDBASEDEF 3.0 (H) 09/25/2019 1907   O2SAT 99.0 09/25/2019 1907   CBG (last 3)  Recent Labs    09/28/19 1544 09/29/19 0437 09/29/19 0838  GLUCAP 87 113* 124*    Assessment/Plan: S/P Procedure(s) (LRB): CORONARY ARTERY BYPASS GRAFTING (CABG), ON PUMP, TIMES FIVE, USING BILATERAL INTERNAL MAMMARY ARTERIES, ENDOSCOPICALLY HARVESTED RIGHT GREATER SAPHENOUS VEIN, AND LEFT RADIAL ARTERY HARVESTING (OPEN) (N/A) RADIAL ARTERY HARVEST (Left) MAZE (N/A) TRANSESOPHAGEAL ECHOCARDIOGRAM (TEE) (N/A) INDOCYANINE GREEN FLUORESCENCE IMAGING (ICG) (N/A) CLIPPING OF ATRIAL APPENDAGE (Left)  1. CV- NSR, BP is elevated- will continue Lopressor, Isordil, add Lisinopril 5 mg daily 2. Pulm- no acute issues, continue IS 3. Renal- creatinine stable, weight is trending down, continue Lasix, potassium  4. ID- minor skin breakdown lower portion of sternotomy, not infected will cover with Keflex to ensure this doesn't progress 5. dispo- patient stable, will d/c home today   LOS: 5 days    Lowella Dandy, PA-C 09/30/2019

## 2019-10-05 ENCOUNTER — Other Ambulatory Visit: Payer: Self-pay | Admitting: Cardiothoracic Surgery

## 2019-10-05 DIAGNOSIS — Z951 Presence of aortocoronary bypass graft: Secondary | ICD-10-CM

## 2019-10-08 ENCOUNTER — Other Ambulatory Visit: Payer: Self-pay

## 2019-10-08 ENCOUNTER — Ambulatory Visit
Admission: RE | Admit: 2019-10-08 | Discharge: 2019-10-08 | Disposition: A | Discharge status: Home / Self Care | Payer: 59 | Source: Ambulatory Visit | Attending: Cardiothoracic Surgery | Admitting: Cardiothoracic Surgery

## 2019-10-08 ENCOUNTER — Ambulatory Visit (INDEPENDENT_AMBULATORY_CARE_PROVIDER_SITE_OTHER): Payer: Self-pay | Admitting: Cardiothoracic Surgery

## 2019-10-08 ENCOUNTER — Encounter: Payer: Self-pay | Admitting: *Deleted

## 2019-10-08 VITALS — BP 121/79 | HR 100 | Temp 97.6°F | Resp 20 | Ht 65.0 in | Wt 222.0 lb

## 2019-10-08 DIAGNOSIS — Z951 Presence of aortocoronary bypass graft: Secondary | ICD-10-CM

## 2019-10-08 DIAGNOSIS — Z006 Encounter for examination for normal comparison and control in clinical research program: Secondary | ICD-10-CM

## 2019-10-08 DIAGNOSIS — I2510 Atherosclerotic heart disease of native coronary artery without angina pectoris: Secondary | ICD-10-CM

## 2019-10-08 NOTE — Research (Signed)
Karen Galloway met criteria for the TRAC-AF registry. The registry was discussed with the patient including risk/benefits. She was given ample time to read the consent and ask questions. The consent was signed and a copy was given to Karen Galloway. No registry required procedures were completed prior to the informed consent.

## 2019-10-08 NOTE — Progress Notes (Signed)
[address]       [name] [address]             [phone]     CARDIOTHORACIC SURGERY OFFICE NOTE  Referring Provider is Lady Gary Darlin Priestly, MD Primary Cardiologist is No primary care provider on file. 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 Denies chest pain, but does not mild DOE, which sounds to be improving slowly. Has lost 20 lbs but has a good appetite.    Current Outpatient Medications  Medication Sig Dispense Refill  . acetaminophen (TYLENOL) 500 MG tablet Take 2 tablets (1,000 mg total) by mouth every 6 (six) hours as needed for mild pain, fever or headache. 30 tablet 0  . aspirin EC 81 MG tablet Take 81 mg by mouth daily. Swallow whole.    Marland Kitchen atorvastatin (LIPITOR) 10 MG tablet Take 10 mg by mouth at bedtime.    . Calcium Carb-Cholecalciferol (CALCIUM-VITAMIN D) 600-400 MG-UNIT TABS Take 1 tablet by mouth daily.    . clopidogrel (PLAVIX) 75 MG tablet Take 1 tablet (75 mg total) by mouth daily. 30 tablet 3  . colchicine 0.6 MG tablet Take 1 tablet (0.6 mg total) by mouth 2 (two) times daily. 60 tablet 0  . furosemide (LASIX) 40 MG tablet Take 1 tablet (40 mg total) by mouth daily. 7 tablet 0  . isosorbide dinitrate (ISORDIL) 10 MG tablet Take 1 tablet (10 mg total) by mouth 3 (three) times daily. 90 tablet 0  . lisinopril (ZESTRIL) 5 MG tablet Take 1 tablet (5 mg total) by mouth daily. 30 tablet 3  . metoprolol succinate (TOPROL-XL) 25 MG 24 hr tablet Take 25 mg by mouth daily.    . Multiple Vitamins-Minerals (MULTIVITAMIN WITH MINERALS) tablet Take 1 tablet by mouth daily.    Marland Kitchen omeprazole (PRILOSEC) 20 MG capsule Take 20 mg by mouth daily.    . potassium chloride 20 MEQ TBCR Take 20 mEq by mouth daily. 7 tablet 0  . traMADol (ULTRAM) 50 MG tablet Take 1 tablet (50 mg total) by mouth every 4 (four) hours as needed for moderate pain.  30 tablet 0  . cephALEXin (KEFLEX) 500 MG capsule Take 1 capsule (500 mg total) by mouth 3 (three) times daily. (Patient not taking: Reported on 10/08/2019) 21 capsule 0   No current facility-administered medications for this visit.      Physical Exam:   BP 121/79   Pulse 100   Temp 97.6 F (36.4 C) (Skin)   Resp 20   Ht 5\' 5"  (1.651 m)   Wt 100.7 kg   SpO2 95% Comment: RA  BMI 36.94 kg/m   General:  Well-appearing, NAD  Chest:   cta  CV:   rrr  Incisions:  Well-healing; left forearm incision staples are removed, and wound is steri-stripped  Abdomen:  sntnd  Extremities:  No edema  Diagnostic Tests:  CXR with clear lung fields   Impression:  Doing well after CABG  Plan:  F/u in 2 weeks Continue to observe sternal precautions Ok to stop lasix/potassium and colchicine  Continue DAPT  I spent in excess of 20 minutes during the conduct of this office consultation and >50% of this time involved direct face-to-face encounter with the patient for counseling and/or coordination of their care.  Level 2  10 minutes Level 3                 15 minutes Level 4                 25 minutes Level 5                 40 minutes  B. Lorayne Marek, MD 10/08/2019 3:18 PM

## 2019-10-22 ENCOUNTER — Other Ambulatory Visit: Payer: Self-pay | Admitting: Physician Assistant

## 2019-10-22 ENCOUNTER — Other Ambulatory Visit: Payer: Self-pay

## 2019-10-22 ENCOUNTER — Ambulatory Visit: Payer: Self-pay | Admitting: Cardiothoracic Surgery

## 2019-10-22 VITALS — BP 116/75 | HR 99 | Temp 97.2°F | Resp 20 | Ht 65.0 in | Wt 222.0 lb

## 2019-10-22 DIAGNOSIS — Z951 Presence of aortocoronary bypass graft: Secondary | ICD-10-CM

## 2019-10-22 NOTE — Progress Notes (Signed)
[address]       [place] [address]             [phone]     CARDIOTHORACIC SURGERY OFFICE NOTE  Referring Provider is Lady Gary Darlin Priestly, MD Primary Cardiologist is No primary care provider on file. PCP is Patrice Paradise, MD   HPI:  60 year old lady status post CABG on 09/25/2019.  She presents for her second outpatient visit.  She has no complaints of shortness of breath or chest pain.  No questions about her medication regimen.   Current Outpatient Medications  Medication Sig Dispense Refill  . acetaminophen (TYLENOL) 500 MG tablet Take 2 tablets (1,000 mg total) by mouth every 6 (six) hours as needed for mild pain, fever or headache. 30 tablet 0  . aspirin EC 81 MG tablet Take 81 mg by mouth daily. Swallow whole.    Marland Kitchen atorvastatin (LIPITOR) 10 MG tablet Take 10 mg by mouth at bedtime.    . Calcium Carb-Cholecalciferol (CALCIUM-VITAMIN D) 600-400 MG-UNIT TABS Take 1 tablet by mouth daily.    . cephALEXin (KEFLEX) 500 MG capsule Take 1 capsule (500 mg total) by mouth 3 (three) times daily. 21 capsule 0  . clopidogrel (PLAVIX) 75 MG tablet Take 1 tablet (75 mg total) by mouth daily. 30 tablet 3  . colchicine 0.6 MG tablet Take 1 tablet (0.6 mg total) by mouth 2 (two) times daily. 60 tablet 0  . furosemide (LASIX) 40 MG tablet Take 1 tablet (40 mg total) by mouth daily. 7 tablet 0  . isosorbide dinitrate (ISORDIL) 10 MG tablet Take 1 tablet (10 mg total) by mouth 3 (three) times daily. 90 tablet 0  . lisinopril (ZESTRIL) 5 MG tablet Take 1 tablet (5 mg total) by mouth daily. 30 tablet 3  . metoprolol succinate (TOPROL-XL) 25 MG 24 hr tablet Take 25 mg by mouth daily.    . Multiple Vitamins-Minerals (MULTIVITAMIN WITH MINERALS) tablet Take 1 tablet by mouth daily.    Marland Kitchen omeprazole (PRILOSEC) 20 MG capsule Take 20 mg by mouth daily.    . potassium chloride 20 MEQ TBCR Take 20 mEq by mouth daily. 7 tablet 0  . traMADol (ULTRAM) 50 MG tablet Take 1 tablet  (50 mg total) by mouth every 4 (four) hours as needed for moderate pain. 30 tablet 0   No current facility-administered medications for this visit.      Physical Exam:   BP 116/75 (BP Location: Right Arm, Patient Position: Sitting, Cuff Size: Large)   Pulse 99   Temp (!) 97.2 F (36.2 C) (Temporal)   Resp 20   Ht 5\' 5"  (1.651 m)   Wt 100.7 kg   SpO2 94% Comment: RA  BMI 36.94 kg/m   General:  Well-appearing no acute distress  Chest:   Clear to auscultation  CV:   Regular rate and rhythm  Incisions:  Healing well  Abdomen:  Soft nontender  Extremities:  No edema  Diagnostic Tests:  No new tests   Impression:  Doing very well after CABG  Plan:  Follow-up as needed with thoracic surgery; continue to follow-up with cardiology in [facility] to drive now, liberalize sternal precautions Return to work on 11/26/2019 without limitation   I spent in excess of 20 minutes during the conduct of this office consultation and >50% of this time involved direct face-to-face encounter with the patient for counseling and/or coordination of their care.  Level 2  10 minutes Level 3                 15 minutes Level 4                 25 minutes Level 5                 40 minutes  B. Lorayne Marek, MD 10/22/2019 10:48 AM

## 2019-10-26 ENCOUNTER — Telehealth: Payer: Self-pay

## 2019-10-26 NOTE — Telephone Encounter (Signed)
Return to work date will be "Return to work on 11/26/2019 without limitation" per Dr Vickey Sages

## 2019-10-26 NOTE — Telephone Encounter (Signed)
FMLA form/ [employer] completed and faxed to [phone]  Beginning leave 09/25/19/ Return to work date 12/24/19 Form was mailed to patient's home address

## 2019-12-23 ENCOUNTER — Other Ambulatory Visit: Payer: Self-pay | Admitting: Physician Assistant

## 2019-12-27 ENCOUNTER — Other Ambulatory Visit: Payer: Self-pay | Admitting: Physician Assistant

## 2020-07-09 ENCOUNTER — Other Ambulatory Visit: Payer: Self-pay | Admitting: Physician Assistant

## 2020-07-09 DIAGNOSIS — Z1231 Encounter for screening mammogram for malignant neoplasm of breast: Secondary | ICD-10-CM

## 2020-07-15 ENCOUNTER — Other Ambulatory Visit: Payer: Self-pay

## 2020-07-15 ENCOUNTER — Ambulatory Visit
Admission: RE | Admit: 2020-07-15 | Discharge: 2020-07-15 | Disposition: A | Discharge status: Home / Self Care | Payer: 59 | Source: Ambulatory Visit | Attending: Physician Assistant | Admitting: Physician Assistant

## 2020-07-15 DIAGNOSIS — Z1231 Encounter for screening mammogram for malignant neoplasm of breast: Secondary | ICD-10-CM | POA: Diagnosis not present

## 2021-01-07 ENCOUNTER — Other Ambulatory Visit: Payer: Self-pay | Admitting: Family Medicine

## 2021-01-07 DIAGNOSIS — Z1231 Encounter for screening mammogram for malignant neoplasm of breast: Secondary | ICD-10-CM

## 2021-01-16 ENCOUNTER — Encounter: Payer: Self-pay | Admitting: *Deleted

## 2021-01-19 ENCOUNTER — Encounter: Admission: RE | Payer: Self-pay | Source: Home / Self Care

## 2021-01-19 ENCOUNTER — Ambulatory Visit: Admission: RE | Admit: 2021-01-19 | Payer: 59 | Source: Home / Self Care

## 2021-01-19 HISTORY — DX: Cardiac arrhythmia, unspecified: I49.9

## 2021-01-19 HISTORY — DX: Anemia, unspecified: D64.9

## 2021-01-19 HISTORY — DX: Unspecified osteoarthritis, unspecified site: M19.90

## 2021-01-19 HISTORY — DX: Hyperlipidemia, unspecified: E78.5

## 2021-01-19 SURGERY — COLONOSCOPY WITH PROPOFOL
Anesthesia: General

## 2021-04-06 ENCOUNTER — Ambulatory Visit: Admission: RE | Admit: 2021-04-06 | Payer: 59 | Source: Home / Self Care

## 2021-04-06 ENCOUNTER — Encounter: Admission: RE | Payer: Self-pay | Source: Home / Self Care

## 2021-04-06 SURGERY — COLONOSCOPY
Anesthesia: General

## 2021-07-08 ENCOUNTER — Other Ambulatory Visit: Payer: Self-pay | Admitting: Physician Assistant

## 2021-07-08 DIAGNOSIS — Z1231 Encounter for screening mammogram for malignant neoplasm of breast: Secondary | ICD-10-CM

## 2021-07-24 ENCOUNTER — Ambulatory Visit
Admission: RE | Admit: 2021-07-24 | Discharge: 2021-07-24 | Disposition: A | Discharge status: Home / Self Care | Payer: 59 | Source: Ambulatory Visit | Attending: Physician Assistant | Admitting: Physician Assistant

## 2021-07-24 DIAGNOSIS — Z1231 Encounter for screening mammogram for malignant neoplasm of breast: Secondary | ICD-10-CM | POA: Insufficient documentation

## 2022-06-22 IMAGING — MG MM DIGITAL SCREENING BILAT W/ TOMO AND CAD
8 series · 8 of 24 positions shown · non-contrast
Comparison: Previous exam(s).

ACR Breast Density Category a: The breast tissue is almost entirely
fatty.

CLINICAL DATA: Screening.

EXAM:
DIGITAL SCREENING BILATERAL MAMMOGRAM WITH TOMOSYNTHESIS AND CAD
TECHNIQUE: Bilateral screening digital craniocaudal and mediolateral oblique
mammograms were obtained. Bilateral screening digital breast
tomosynthesis was performed. The images were evaluated with
computer-aided detection.

[R CC synth-2D]
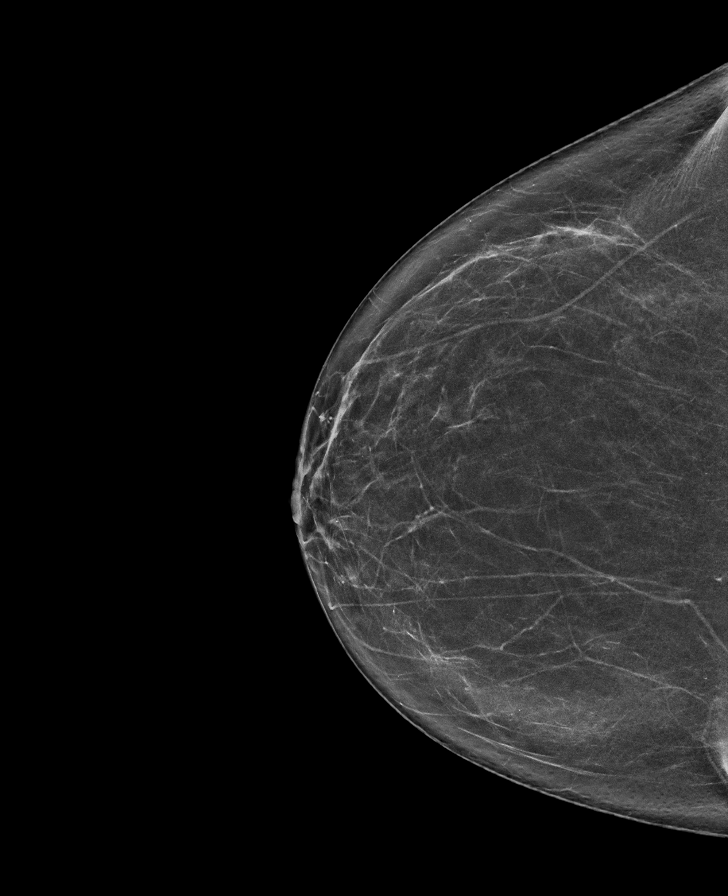

[R MLO synth-2D]
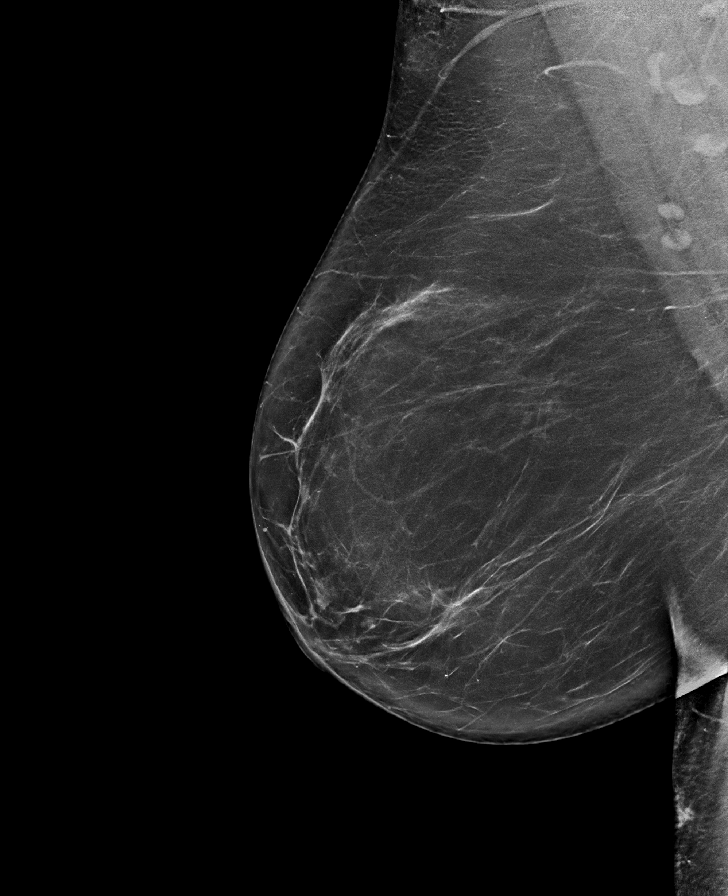

[L MLO synth-2D]
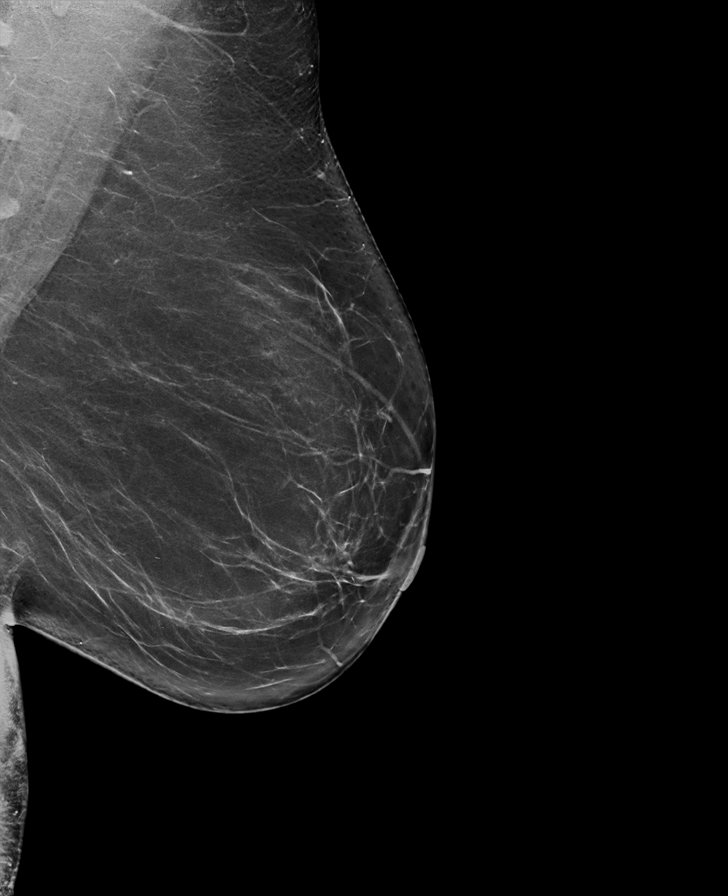

[L CC synth-2D]
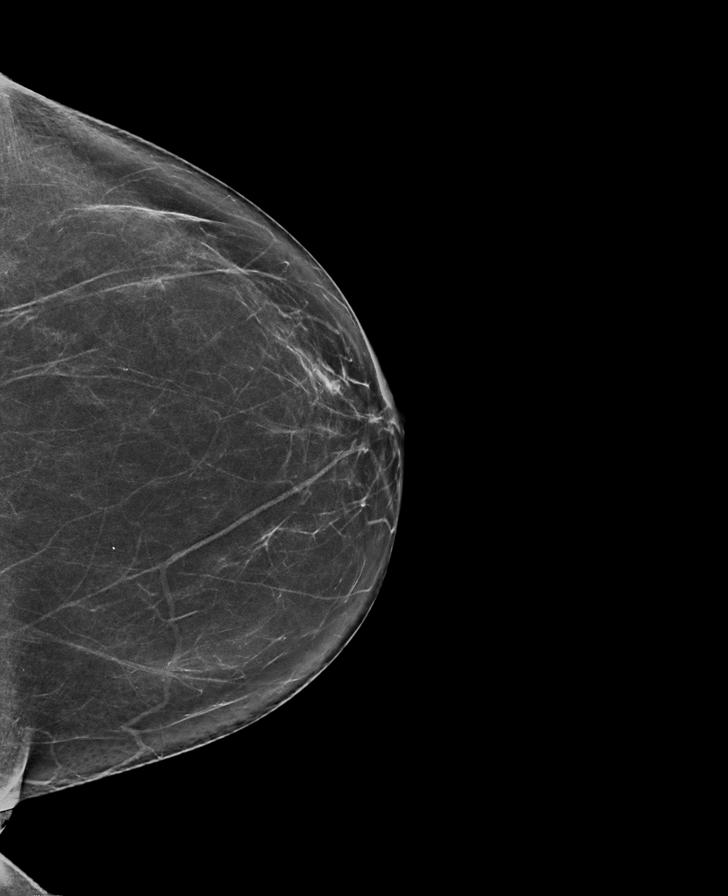

[R MLO tomo · tomo slice 45/88.0]
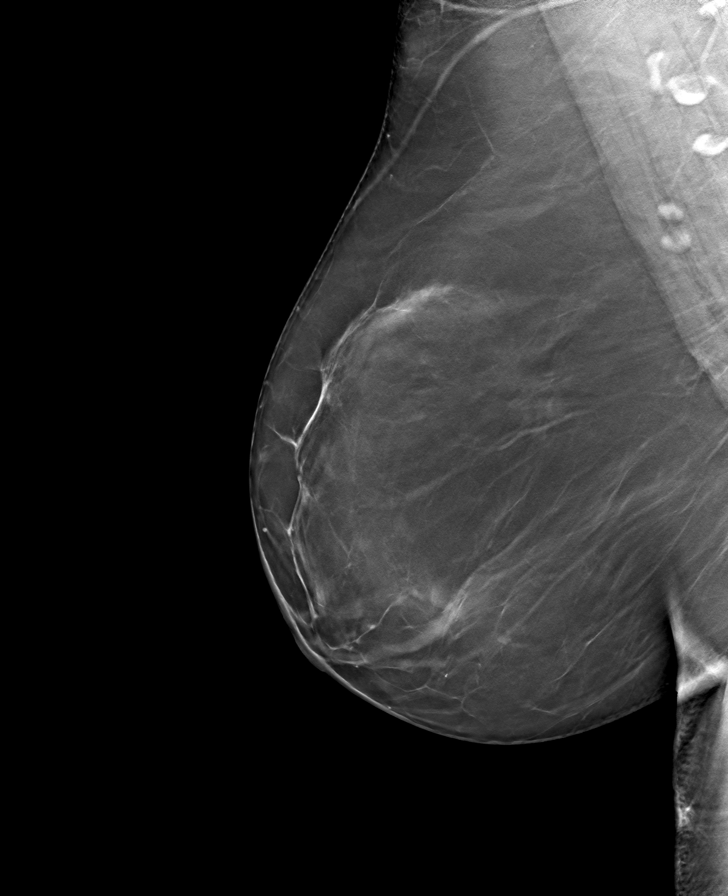

[L MLO tomo · tomo slice 43/86.0]
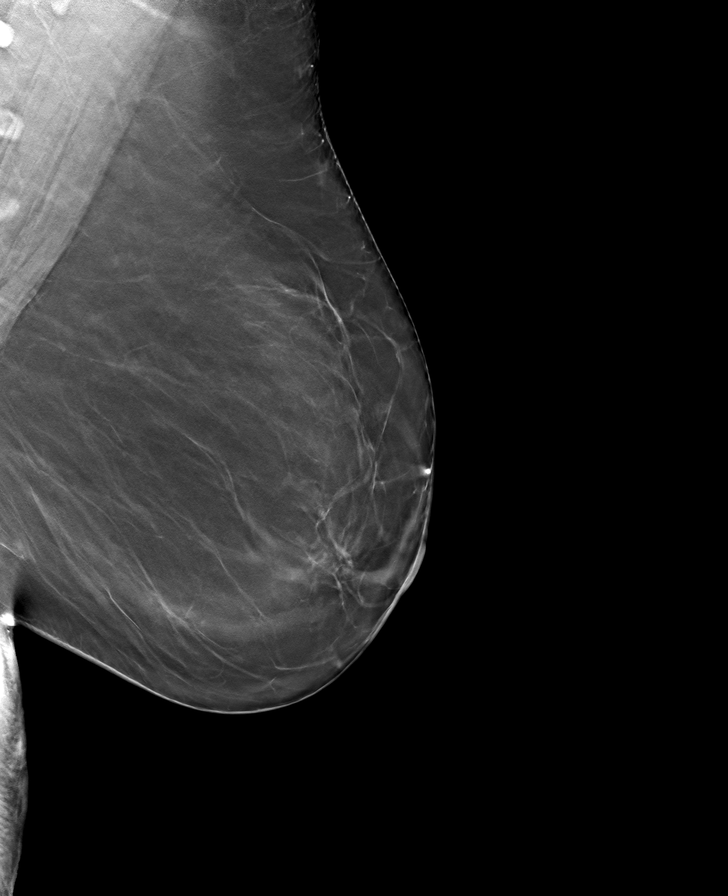

[L CC tomo · tomo slice 37/72.0]
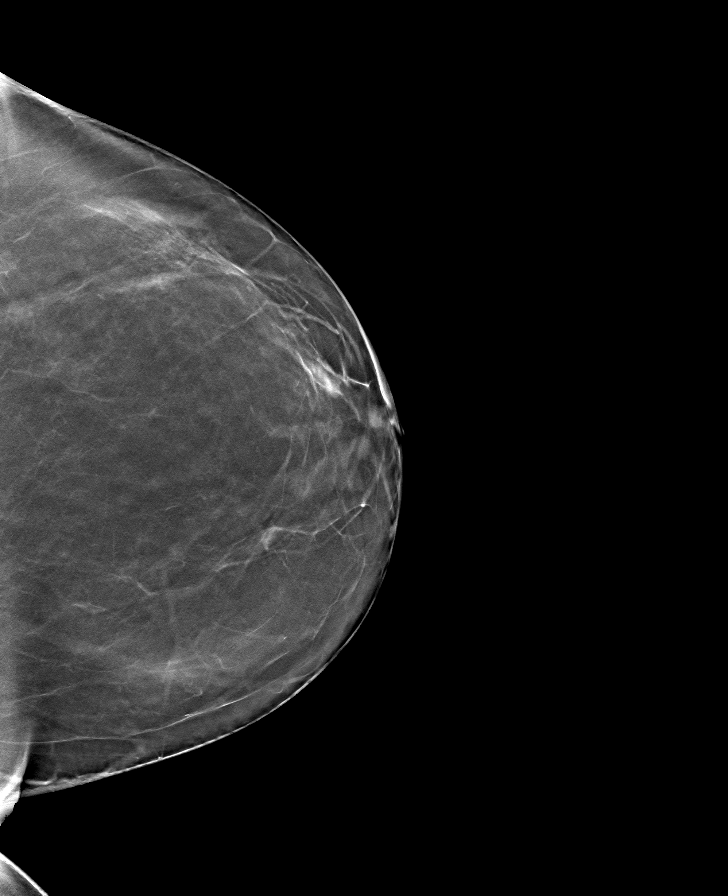

[R CC tomo · tomo slice 37/72.0]
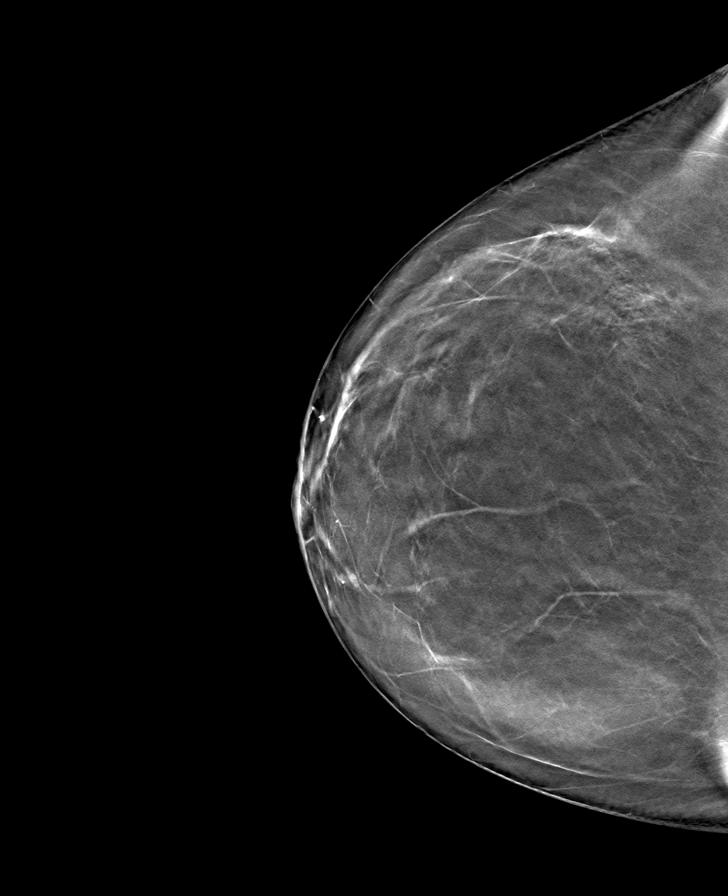

[8 of 24 positions shown; findings below may reference images not displayed]

FINDINGS: There are no findings suspicious for malignancy.
IMPRESSION: No mammographic evidence of malignancy. A result letter of this
screening mammogram will be mailed directly to the patient.

RECOMMENDATION:
Screening mammogram in one year. (Code:0E-3-N98)

BI-RADS CATEGORY  1: Negative.

## 2022-08-02 ENCOUNTER — Other Ambulatory Visit: Payer: Self-pay | Admitting: Physician Assistant

## 2022-08-02 DIAGNOSIS — Z1231 Encounter for screening mammogram for malignant neoplasm of breast: Secondary | ICD-10-CM

## 2022-12-31 ENCOUNTER — Ambulatory Visit: Payer: 59

## 2022-12-31 DIAGNOSIS — Z860101 Personal history of adenomatous and serrated colon polyps: Secondary | ICD-10-CM | POA: Diagnosis not present

## 2022-12-31 DIAGNOSIS — K640 First degree hemorrhoids: Secondary | ICD-10-CM | POA: Diagnosis not present

## 2022-12-31 DIAGNOSIS — Z09 Encounter for follow-up examination after completed treatment for conditions other than malignant neoplasm: Secondary | ICD-10-CM | POA: Diagnosis present

## 2022-12-31 DIAGNOSIS — K5730 Diverticulosis of large intestine without perforation or abscess without bleeding: Secondary | ICD-10-CM | POA: Diagnosis not present
